# Patient Record
Sex: Female | Born: 1960 | Race: White | Hispanic: No | Marital: Single | Smoking: Never smoker
Health system: Southern US, Community
[De-identification: ages and names within clinical notes are randomized; demographics above are authoritative.]

## PROBLEM LIST (undated history)

## (undated) DIAGNOSIS — E039 Hypothyroidism, unspecified: Secondary | ICD-10-CM

## (undated) DIAGNOSIS — Z91199 Patient's noncompliance with other medical treatment and regimen due to unspecified reason: Secondary | ICD-10-CM

## (undated) DIAGNOSIS — Z9114 Patient's other noncompliance with medication regimen: Secondary | ICD-10-CM

## (undated) DIAGNOSIS — G43909 Migraine, unspecified, not intractable, without status migrainosus: Secondary | ICD-10-CM

## (undated) DIAGNOSIS — F329 Major depressive disorder, single episode, unspecified: Secondary | ICD-10-CM

## (undated) DIAGNOSIS — F419 Anxiety disorder, unspecified: Secondary | ICD-10-CM

## (undated) DIAGNOSIS — R0683 Snoring: Secondary | ICD-10-CM

## (undated) DIAGNOSIS — J449 Chronic obstructive pulmonary disease, unspecified: Secondary | ICD-10-CM

## (undated) DIAGNOSIS — E785 Hyperlipidemia, unspecified: Secondary | ICD-10-CM

## (undated) DIAGNOSIS — F32A Depression, unspecified: Secondary | ICD-10-CM

## (undated) DIAGNOSIS — G4733 Obstructive sleep apnea (adult) (pediatric): Secondary | ICD-10-CM

## (undated) DIAGNOSIS — G4700 Insomnia, unspecified: Secondary | ICD-10-CM

## (undated) DIAGNOSIS — F988 Other specified behavioral and emotional disorders with onset usually occurring in childhood and adolescence: Secondary | ICD-10-CM

## (undated) HISTORY — DX: Other specified behavioral and emotional disorders with onset usually occurring in childhood and adolescence: F98.8

## (undated) HISTORY — DX: Snoring: R06.83

## (undated) HISTORY — DX: Hyperlipidemia, unspecified: E78.5

## (undated) HISTORY — DX: Major depressive disorder, single episode, unspecified: F32.9

## (undated) HISTORY — PX: CLOSED REDUCTION ANKLE FRACTURE: SUR210

## (undated) HISTORY — DX: Anxiety disorder, unspecified: F41.9

## (undated) HISTORY — DX: Hypothyroidism, unspecified: E03.9

## (undated) HISTORY — DX: Migraine, unspecified, not intractable, without status migrainosus: G43.909

## (undated) HISTORY — DX: Chronic obstructive pulmonary disease, unspecified: J44.9

## (undated) HISTORY — DX: Patient's noncompliance with other medical treatment and regimen due to unspecified reason: Z91.199

## (undated) HISTORY — DX: Patient's other noncompliance with medication regimen: Z91.14

## (undated) HISTORY — DX: Obstructive sleep apnea (adult) (pediatric): G47.33

## (undated) HISTORY — DX: Depression, unspecified: F32.A

## (undated) HISTORY — DX: Insomnia, unspecified: G47.00

---

## 1998-10-11 ENCOUNTER — Ambulatory Visit: Admission: RE | Admit: 1998-10-11 | Discharge: 1998-10-11 | Payer: Self-pay

## 1999-01-06 ENCOUNTER — Ambulatory Visit: Admission: RE | Admit: 1999-01-06 | Discharge: 1999-01-06 | Payer: Self-pay | Admitting: Family Medicine

## 2002-11-06 ENCOUNTER — Encounter: Payer: Self-pay | Admitting: Family Medicine

## 2002-11-06 ENCOUNTER — Ambulatory Visit (HOSPITAL_COMMUNITY): Admission: RE | Admit: 2002-11-06 | Discharge: 2002-11-06 | Payer: Self-pay | Admitting: Family Medicine

## 2006-04-25 HISTORY — PX: OTHER SURGICAL HISTORY: SHX169

## 2006-05-09 ENCOUNTER — Encounter: Admission: RE | Admit: 2006-05-09 | Discharge: 2006-05-09 | Payer: Self-pay | Admitting: Otolaryngology

## 2006-10-10 ENCOUNTER — Encounter: Admission: RE | Admit: 2006-10-10 | Discharge: 2006-10-10 | Payer: Self-pay | Admitting: Internal Medicine

## 2006-12-18 ENCOUNTER — Ambulatory Visit: Payer: Self-pay | Admitting: Internal Medicine

## 2006-12-18 DIAGNOSIS — R197 Diarrhea, unspecified: Secondary | ICD-10-CM

## 2006-12-18 DIAGNOSIS — R195 Other fecal abnormalities: Secondary | ICD-10-CM

## 2006-12-18 DIAGNOSIS — R1032 Left lower quadrant pain: Secondary | ICD-10-CM | POA: Insufficient documentation

## 2007-01-30 ENCOUNTER — Ambulatory Visit: Payer: Self-pay | Admitting: Internal Medicine

## 2007-03-06 ENCOUNTER — Ambulatory Visit (HOSPITAL_COMMUNITY): Admission: RE | Admit: 2007-03-06 | Discharge: 2007-03-06 | Payer: Self-pay | Admitting: Internal Medicine

## 2007-04-04 ENCOUNTER — Ambulatory Visit: Payer: Self-pay | Admitting: Internal Medicine

## 2007-04-04 DIAGNOSIS — K589 Irritable bowel syndrome without diarrhea: Secondary | ICD-10-CM | POA: Insufficient documentation

## 2007-04-24 DIAGNOSIS — G4730 Sleep apnea, unspecified: Secondary | ICD-10-CM | POA: Insufficient documentation

## 2007-04-24 DIAGNOSIS — F909 Attention-deficit hyperactivity disorder, unspecified type: Secondary | ICD-10-CM | POA: Insufficient documentation

## 2007-04-24 DIAGNOSIS — F329 Major depressive disorder, single episode, unspecified: Secondary | ICD-10-CM

## 2007-12-04 ENCOUNTER — Encounter: Admission: RE | Admit: 2007-12-04 | Discharge: 2007-12-04 | Payer: Self-pay | Admitting: Internal Medicine

## 2009-02-26 ENCOUNTER — Encounter: Admission: RE | Admit: 2009-02-26 | Discharge: 2009-02-26 | Payer: Self-pay | Admitting: Internal Medicine

## 2009-08-27 DIAGNOSIS — F419 Anxiety disorder, unspecified: Secondary | ICD-10-CM | POA: Insufficient documentation

## 2009-08-27 DIAGNOSIS — E039 Hypothyroidism, unspecified: Secondary | ICD-10-CM | POA: Insufficient documentation

## 2010-09-07 NOTE — Assessment & Plan Note (Signed)
[facility]                         GASTROENTEROLOGY OFFICE NOTE   NAME:[name], [name]                        MRN:          [id number]  DATE:04/04/2007                            DOB:          04-19-1961    HISTORY OF PRESENT ILLNESS:  Karla [name] presents today for follow up.  She  was evaluated December 18, 2006, for abdominal pain, rectal pain,  diarrhea.  See that dictation for details.  She subsequently underwent  complete colonoscopy with intubation of the terminal ilium January 30, 2007.  The examination was normal.  She was felt to have irritable  bowel.  She also underwent an abdominal ultrasound March 06, 2007.  This was normal.  She presents now for follow up.   We discussed the results of her examinations.  She tells me that after  her colonoscopy her bowels were the best they have ever been in years.  However, they have returned to their usual which is unpredictable  urgency associated with loose stools alternating with periods of  constipation.  She has not self medicated.  She cannot seem to identify  a time of day that these problems are worse.  She states the diarrhea is  more problematic than constipation.  The problem seems to occur when she  is away from home.  No nocturnal symptoms.   PHYSICAL EXAMINATION:  GENERAL:  A well-appearing female in no acute  distress.  VITAL SIGNS:  Blood pressure 120/68, heart rate 88, weight 121.8 pounds.  ABDOMEN:  Was not reexamined.   IMPRESSION:  Irritable bowel syndrome with urgency and diarrhea  alternating with constipation.   RECOMMENDATIONS:  1. Fiber supplementation with Metamucil two tablespoons daily.  2. Probiotic Align one daily for two weeks.  3. Office follow up in six weeks to assess response to therapies.     Wilhemina Bonito. Marina Goodell, MD  Electronically Signed    JNP/MedQ  DD: 04/04/2007  DT: 04/05/2007  Job #: [id number]   cc:   Kari Baars, M.D.

## 2010-09-07 NOTE — Assessment & Plan Note (Signed)
[facility]                         GASTROENTEROLOGY OFFICE NOTE   NAME:POFFKazuko, Karla [name]                        MRN:          [id number]  DATE:12/18/2006                            DOB:          08/17/1960    OFFICE CONSULTATION NOTE:   REASON FOR CONSULTATION:  Abdominal pain, rectal bleeding, and diarrhea.   HISTORY:  This is a 50 year old white female with a history of ADHD,  depression, and sleep apnea.  She is referred through the courtesy of  Dr. Clelia Croft regarding the above-listed abdominal complaints.  Patient  reports chronic problems with loose stools alternating with constipation  most of her life.  These symptoms seem relatively mild and improved with  defecation.  However, more recently she described in mid to late May  awakening at night with severe abdominal pain and diaphoresis.  She had  a constipated feeling, though had diarrhea followed by rectal bleeding.  She has had several similar episodes.  She will occasionally have nausea  and vomiting.  Her abdominal pain tends to be in the left lower  quadrant.  She has not had formal GI workup.  Laboratories at Dr. Alver Fisher  office on Sep 06, 2006 were unremarkable.  Comprehensive metabolic panel  was normal, as was a CBC and lipid panel.  Urinalysis was also negative.  TSH was minimally elevated at 4.7.   PAST MEDICAL HISTORY:  1. ADHD.  2. Depression.  3. Sleep apnea.   PAST SURGICAL HISTORY:  1. Nasal surgery for abscess.  2. Tonsillectomy.   ALLERGIES:  SULFA.   CURRENT MEDICATIONS:  1. Adderall 30 mg 1-2 daily.  2. Yasmin birth control pill.  3. Remeron, unspecified dosage daily.  4. Xanax p.r.n.   FAMILY HISTORY:  Negative for inflammatory bowel disease or  gastrointestinal malignancy.  Mother has a history of diabetes.   SOCIAL HISTORY:  Patient is divorced, lives alone, has a Market researcher, is a Runner, broadcasting/film/video for [employer].  Does not smoke.  Does  use  alcohol.   REVIEW OF SYSTEMS:  Per diagnostic evaluation form.   PHYSICAL EXAMINATION:  A well-appearing female in no acute distress.  Blood pressure 120/68, heart rate 72, weight is 112.6 pounds.  She is 5  feet 6 inches in height.  HEENT:  Sclerae are anicteric.  Conjunctivae are pink.  Oral mucosa  intact.  No adenopathy.  LUNGS:  Clear.  HEART:  Regular.  ABDOMEN:  Soft without tenderness, mass, or hernia.  Good bowel sounds  heard.  RECTAL:  Deferred.  EXTREMITIES:  Without edema.   IMPRESSION:  1. Severe bout of abdominal pain followed by loose stools and      bleeding, consistent with ischemic colitis.  2. More chronic abdominal complaints consistent with irritable bowel      syndrome.  3. Intermittent rectal bleeding, possibly due to hemorrhoids.   RECOMMENDATIONS:  1. Abdominal ultrasound to rule out gallstones as a cause for pain.  2. Schedule colonoscopy to rule out inflammatory bowel disease or      neoplasia.   The patient requests having the evaluations performed  after October 1  for financial reasons.     Wilhemina Bonito. Marina Goodell, MD  Electronically Signed    JNP/MedQ  DD: 12/18/2006  DT: 12/19/2006  Job #: [id number]   cc:   Kari Baars, M.D.

## 2014-04-10 ENCOUNTER — Encounter: Payer: Self-pay | Admitting: *Deleted

## 2014-04-14 ENCOUNTER — Other Ambulatory Visit: Payer: Self-pay

## 2014-04-14 DIAGNOSIS — Z1231 Encounter for screening mammogram for malignant neoplasm of breast: Secondary | ICD-10-CM

## 2014-04-16 ENCOUNTER — Ambulatory Visit
Admission: RE | Admit: 2014-04-16 | Discharge: 2014-04-16 | Disposition: A | Discharge status: Home / Self Care | Payer: BC Managed Care – PPO | Source: Ambulatory Visit

## 2014-04-16 DIAGNOSIS — Z1231 Encounter for screening mammogram for malignant neoplasm of breast: Secondary | ICD-10-CM

## 2014-04-21 ENCOUNTER — Ambulatory Visit (INDEPENDENT_AMBULATORY_CARE_PROVIDER_SITE_OTHER): Payer: BC Managed Care – PPO | Admitting: Neurology

## 2014-04-21 ENCOUNTER — Encounter: Payer: Self-pay | Admitting: Neurology

## 2014-04-21 VITALS — BP 120/78 | HR 89 | Resp 16 | Ht 65.5 in | Wt 128.0 lb

## 2014-04-21 DIAGNOSIS — R0683 Snoring: Secondary | ICD-10-CM

## 2014-04-21 HISTORY — DX: Snoring: R06.83
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
## 2014-05-30 ENCOUNTER — Ambulatory Visit (INDEPENDENT_AMBULATORY_CARE_PROVIDER_SITE_OTHER): Payer: BC Managed Care – PPO | Admitting: Neurology

## 2014-05-30 VITALS — BP 135/80 | HR 96

## 2014-05-30 DIAGNOSIS — R0683 Snoring: Secondary | ICD-10-CM

## 2014-05-30 DIAGNOSIS — G4719 Other hypersomnia: Secondary | ICD-10-CM

## 2014-05-30 DIAGNOSIS — G479 Sleep disorder, unspecified: Secondary | ICD-10-CM

## 2014-05-31 NOTE — Sleep Study (Signed)
Please see the scanned sleep study interpretation located in the Procedure Tab within the Chart review section

## 2014-06-16 ENCOUNTER — Encounter: Payer: Self-pay | Admitting: Neurology

## 2014-06-16 DIAGNOSIS — G4719 Other hypersomnia: Secondary | ICD-10-CM | POA: Insufficient documentation

## 2014-06-18 ENCOUNTER — Telehealth: Payer: Self-pay | Admitting: *Deleted

## 2014-06-18 NOTE — Telephone Encounter (Signed)
Patient was contacted and provided a detailed message regarding her sleep study results.  She was instructed to contact our office to schedule a 30 min follow up appointment with Dr. Vickey Hugerohmeier to go over the results.  Dr. Martha ClanWilliam Shaw was faxed a copy of the results.

## 2015-03-17 DIAGNOSIS — G2581 Restless legs syndrome: Secondary | ICD-10-CM | POA: Insufficient documentation

## 2015-04-29 ENCOUNTER — Other Ambulatory Visit: Payer: Self-pay

## 2015-04-29 DIAGNOSIS — Z1231 Encounter for screening mammogram for malignant neoplasm of breast: Secondary | ICD-10-CM

## 2015-05-19 ENCOUNTER — Ambulatory Visit
Admission: RE | Admit: 2015-05-19 | Discharge: 2015-05-19 | Disposition: A | Discharge status: Home / Self Care | Payer: BC Managed Care – PPO | Source: Ambulatory Visit

## 2015-05-19 DIAGNOSIS — Z1231 Encounter for screening mammogram for malignant neoplasm of breast: Secondary | ICD-10-CM

## 2017-02-21 ENCOUNTER — Encounter: Payer: Self-pay | Admitting: Internal Medicine

## 2017-09-22 ENCOUNTER — Ambulatory Visit: Payer: Self-pay | Admitting: Urgent Care

## 2017-09-22 ENCOUNTER — Ambulatory Visit: Payer: Self-pay | Admitting: *Deleted

## 2017-09-22 NOTE — Telephone Encounter (Signed)
Pt reports dropped can on right great toe this am at 0730. States "gash" about 1 inch across top of toe. Bled initially, had to change guaze x 1, present dressing dry for several hours. Pt states she feels it may need sutures. Spoke to [facility] who stated practice would see for laceration. Appt made with Marlowe ShoresM. Mani for today at 1320. Care advise given per protocol.  Reason for Disposition . Skin is split open or gaping  (or length > 1/2 inch or 12 mm)    Spoke with Carollee HerterShannon. Practice will see patient for laceration.  Answer Assessment - Initial Assessment Questions 1. MECHANISM: "How did the injury happen?"      Dropped can on right great toe 2. ONSET: "When did the injury happen?" (Minutes or hours ago)      0730 3. LOCATION: "What part of the toe is injured?" "Is the nail damaged?"      Top, across joint 4. APPEARANCE of TOE INJURY: "What does the injury look like?"      "Gash, appears deep." 5. SEVERITY: "Can you use the foot normally?" "Can you walk?"      Yes 6. SIZE: For cuts, bruises, or swelling, ask: "How large is it?" (e.g., inches or centimeters;  entire toe)      1 inch, bleed initially, changed guaze x 1. Present dressing dry. 7. PAIN: "Is there pain?" If so, ask: "How bad is the pain?"   (e.g., Scale 1-10; or mild, moderate, severe)     Yes, moderate 8. TETANUS: For any breaks in the skin, ask: "When was the last tetanus booster?"     unsure 9. DIABETES: "Do you have a history of diabetes or poor circulation in the feet?"     no 10. OTHER SYMPTOMS: "Do you have any other symptoms?"        no  Protocols used: TOE INJURY-A-AH

## 2018-04-10 DIAGNOSIS — M797 Fibromyalgia: Secondary | ICD-10-CM | POA: Insufficient documentation

## 2018-04-10 DIAGNOSIS — M791 Myalgia, unspecified site: Secondary | ICD-10-CM | POA: Insufficient documentation

## 2019-04-30 DIAGNOSIS — G629 Polyneuropathy, unspecified: Secondary | ICD-10-CM | POA: Insufficient documentation

## 2019-05-03 ENCOUNTER — Other Ambulatory Visit: Payer: Self-pay | Admitting: Internal Medicine

## 2019-05-03 DIAGNOSIS — E785 Hyperlipidemia, unspecified: Secondary | ICD-10-CM

## 2019-05-09 ENCOUNTER — Other Ambulatory Visit (HOSPITAL_COMMUNITY): Payer: Self-pay | Admitting: Internal Medicine

## 2019-05-09 ENCOUNTER — Other Ambulatory Visit: Payer: Self-pay | Admitting: Internal Medicine

## 2019-05-09 DIAGNOSIS — J90 Pleural effusion, not elsewhere classified: Secondary | ICD-10-CM

## 2019-05-10 ENCOUNTER — Other Ambulatory Visit: Payer: Self-pay

## 2019-05-10 ENCOUNTER — Ambulatory Visit (HOSPITAL_COMMUNITY)
Admission: RE | Admit: 2019-05-10 | Discharge: 2019-05-10 | Disposition: A | Discharge status: Home / Self Care | Payer: BC Managed Care – PPO | Source: Ambulatory Visit | Attending: Internal Medicine | Admitting: Internal Medicine

## 2019-05-10 ENCOUNTER — Ambulatory Visit (HOSPITAL_COMMUNITY)
Admission: RE | Admit: 2019-05-10 | Discharge: 2019-05-10 | Disposition: A | Discharge status: Home / Self Care | Payer: BC Managed Care – PPO | Source: Ambulatory Visit | Attending: Student | Admitting: Student

## 2019-05-10 ENCOUNTER — Inpatient Hospital Stay (HOSPITAL_COMMUNITY): Admission: RE | Admit: 2019-05-10 | Payer: BC Managed Care – PPO | Source: Ambulatory Visit

## 2019-05-10 DIAGNOSIS — J90 Pleural effusion, not elsewhere classified: Secondary | ICD-10-CM | POA: Insufficient documentation

## 2019-05-10 DIAGNOSIS — Z9889 Other specified postprocedural states: Secondary | ICD-10-CM

## 2019-05-10 HISTORY — PX: IR THORACENTESIS ASP PLEURAL SPACE W/IMG GUIDE: IMG5380

## 2019-05-10 MED ORDER — LIDOCAINE HCL (PF) 1 % IJ SOLN
INTRAMUSCULAR | Status: DC | PRN
  Administered 2019-05-10: 30 mL

## 2019-05-10 MED ORDER — LIDOCAINE HCL 1 % IJ SOLN
INTRAMUSCULAR | Status: AC
  Filled 2019-05-10: qty 20

## 2019-05-10 NOTE — Procedures (Signed)
PROCEDURE SUMMARY:  Successful US guided right therapeutic thoracentesis. Yielded 500 mL of bloody fluid. Pt tolerated procedure well. No immediate complications.  Specimen was not sent for labs. CXR ordered.  EBL < 5 mL  Hoyt Koch PA-C 05/10/2019 3:10 PM

## 2019-05-13 ENCOUNTER — Ambulatory Visit (HOSPITAL_COMMUNITY): Payer: BC Managed Care – PPO

## 2019-05-23 ENCOUNTER — Telehealth: Payer: Self-pay | Admitting: Physician Assistant

## 2019-05-23 NOTE — Telephone Encounter (Signed)
Called to discuss with Karla [name] about Covid symptoms and the use of bamlanivimab, a monoclonal antibody infusion for those with mild to moderate Covid symptoms and at a high risk of hospitalization.     Pt is not qualified for this infusion due to lack of identified risk factors and co-morbid conditions.  Symptoms reviewed as well as criteria for ending isolation.  Symptoms reviewed that would warrant ED/Hospital evaluation as well should her condition worsen. Preventative practices reviewed. Patient verbalized understanding.  Patient Active Problem List   Diagnosis Date Noted  . Excessive daytime sleepiness 06/16/2014  . Snoring 04/21/2014  . DEPRESSIVE DISORDER NOT ELSEWHERE CLASSIFIED 04/24/2007  . ADHD 04/24/2007  . SLEEP APNEA 04/24/2007  . IRRITABLE BOWEL SYNDROME 04/04/2007  . DIARRHEA 12/18/2006  . ABDOMINAL PAIN, LEFT LOWER QUADRANT 12/18/2006  . NONSPECIFIC ABNORMAL FINDING IN STOOL CONTENTS 12/18/2006     Cline Crock PA-C  [facility]

## 2019-06-05 ENCOUNTER — Other Ambulatory Visit: Payer: Self-pay | Admitting: Internal Medicine

## 2019-06-05 DIAGNOSIS — Z1231 Encounter for screening mammogram for malignant neoplasm of breast: Secondary | ICD-10-CM

## 2019-06-14 ENCOUNTER — Other Ambulatory Visit: Payer: BC Managed Care – PPO

## 2019-06-20 ENCOUNTER — Ambulatory Visit: Payer: BC Managed Care – PPO

## 2019-06-21 ENCOUNTER — Ambulatory Visit: Payer: BC Managed Care – PPO

## 2019-07-03 ENCOUNTER — Other Ambulatory Visit: Payer: Self-pay

## 2019-07-03 ENCOUNTER — Ambulatory Visit
Admission: RE | Admit: 2019-07-03 | Discharge: 2019-07-03 | Disposition: A | Discharge status: Home / Self Care | Payer: BC Managed Care – PPO | Source: Ambulatory Visit | Attending: Internal Medicine | Admitting: Internal Medicine

## 2019-07-03 DIAGNOSIS — Z1231 Encounter for screening mammogram for malignant neoplasm of breast: Secondary | ICD-10-CM

## 2019-07-05 ENCOUNTER — Ambulatory Visit: Payer: BC Managed Care – PPO | Attending: Internal Medicine

## 2019-07-05 DIAGNOSIS — Z23 Encounter for immunization: Secondary | ICD-10-CM

## 2019-07-05 NOTE — Progress Notes (Signed)
   Covid-19 Vaccination Clinic  Name:  Karla [name]    MRN: [id number] DOB: 10-May-1960  07/05/2019  [name] was observed post Covid-19 immunization for 15 minutes without incident. She was provided with Vaccine Information Sheet and instruction to access the V-Safe system.   [name] was instructed to call 911 with any severe reactions post vaccine: Marland Kitchen Difficulty breathing  . Swelling of face and throat  . A fast heartbeat  . A bad rash all over body  . Dizziness and weakness   Immunizations Administered    Name Date Dose VIS Date Route   Pfizer COVID-19 Vaccine 07/05/2019  2:18 PM 0.3 mL 04/05/2019 Intramuscular   Manufacturer: [employer], [employer]   Lot: [id number]   NDC: [address]-5

## 2019-07-11 ENCOUNTER — Ambulatory Visit: Payer: BC Managed Care – PPO

## 2019-07-31 ENCOUNTER — Ambulatory Visit: Payer: BC Managed Care – PPO

## 2019-08-07 ENCOUNTER — Other Ambulatory Visit: Payer: BC Managed Care – PPO

## 2019-11-28 ENCOUNTER — Other Ambulatory Visit: Payer: Self-pay

## 2019-11-28 ENCOUNTER — Ambulatory Visit: Payer: BC Managed Care – PPO | Admitting: Nurse Practitioner

## 2019-11-28 ENCOUNTER — Encounter: Payer: Self-pay | Admitting: Nurse Practitioner

## 2019-11-28 VITALS — BP 132/84 | HR 85 | Temp 97.1°F | Ht 66.0 in | Wt 124.0 lb

## 2019-11-28 DIAGNOSIS — R0602 Shortness of breath: Secondary | ICD-10-CM

## 2019-11-28 DIAGNOSIS — Z8616 Personal history of COVID-19: Secondary | ICD-10-CM | POA: Diagnosis not present

## 2019-11-28 DIAGNOSIS — R053 Chronic cough: Secondary | ICD-10-CM | POA: Insufficient documentation

## 2019-11-28 DIAGNOSIS — R439 Unspecified disturbances of smell and taste: Secondary | ICD-10-CM | POA: Insufficient documentation

## 2019-11-28 DIAGNOSIS — R2689 Other abnormalities of gait and mobility: Secondary | ICD-10-CM | POA: Diagnosis not present

## 2019-11-28 DIAGNOSIS — R413 Other amnesia: Secondary | ICD-10-CM | POA: Diagnosis not present

## 2019-11-28 DIAGNOSIS — R05 Cough: Secondary | ICD-10-CM

## 2019-11-28 DIAGNOSIS — J90 Pleural effusion, not elsewhere classified: Secondary | ICD-10-CM | POA: Insufficient documentation

## 2019-11-28 MED ORDER — OMEPRAZOLE 20 MG PO CPDR: 20.0000 mg | DELAYED_RELEASE_CAPSULE | Freq: Every day | ORAL | 3 refills | Status: AC

## 2019-11-28 NOTE — Progress Notes (Signed)
@Patient  ID: , female    DOB: [birth year], 59 y.o.   MRN: [id number]  Chief Complaint  Patient presents with  . Establish Care    Tested positive January 2021. Sx: Cough, SOB, Brain fog, intermittent smell and taste.     Referring provider: February 2021, MD   59 year old female with history of migraines, sleep apnea, IBS, hypothyroidism, anxiety, depression, hyperlipidemia, ADHD.  Diagnosed with Covid in January 2021.  HPI  Patient presents today for post Covid care clinic visit.  She states that she was diagnosed with Covid in January 2021.  A couple weeks prior to being diagnosed with Covid she had fallen and broke multiple ribs.  She also punctured a lung and underwent thoracentesis.  While she was recovering a neighbor came over to help her move some things in the house and unknowingly exposed her to COVID.  Patient states that since that time she still has chest congestion, cough, shortness of breath with exertion, brain fog and memory loss, intermittent loss of taste and smell with altered taste and smell.  Patient has had a follow-up CT scan in March which showed groundglass opacity bilaterally which reflected slowly resolving pneumonia at that time.  She also had trace pleural effusion.  Recent chest x-ray showed stable left pleural effusion with multiple rib fractures.  Patient has been prescribed Wixela inhaler and albuterol inhaler by PCP.  She states that she does feel like this is helping.  Patient also complains today of ongoing balance and coordination issues following Covid. Denies f/c/s, n/v/d, hemoptysis, PND, chest pain or edema.      Allergies  Allergen Reactions  . Sulfa Antibiotics     Immunization History  Administered Date(s) Administered  . PFIZER SARS-COV-2 Vaccination 07/05/2019    Past Medical History:  Diagnosis Date  . ADD (attention deficit disorder)   . Anxiety   . Depression   . Hyperlipidemia   . Hypothyroidism   . Insomnia    . Migraine   . Noncompliance with CPAP treatment   . OSA (obstructive sleep apnea)   . Snoring 04/21/2014    Tobacco History: Social History   Tobacco Use  Smoking Status Never Smoker   Counseling given: Not Answered   Outpatient Encounter Medications as of 11/28/2019  Medication Sig  . albuterol (PROVENTIL HFA;VENTOLIN HFA) 108 (90 BASE) MCG/ACT inhaler Inhale 2 puffs into the lungs every 6 (six) hours as needed for wheezing or shortness of breath.  . ALPRAZolam (XANAX) 1 MG tablet   . amphetamine-dextroamphetamine (ADDERALL XR) 30 MG 24 hr capsule Take 30 mg by mouth 2 (two) times daily.  01/28/2020 levothyroxine (SYNTHROID, LEVOTHROID) 50 MCG tablet   . mirtazapine (REMERON) 30 MG tablet Take 60 mg by mouth at bedtime.  Marland Kitchen INHUB 250-50 MCG/DOSE AEPB Inhale 1 puff into the lungs 2 (two) times daily.  . DULoxetine (CYMBALTA) 60 MG capsule Take 60 mg by mouth daily.  Monte Fantasia ibuprofen (ADVIL) 600 MG tablet ibuprofen 600 mg tablet  . omeprazole (PRILOSEC) 20 MG capsule Take 1 capsule (20 mg total) by mouth daily.   No facility-administered encounter medications on file as of 11/28/2019.     Review of Systems  Review of Systems  Constitutional: Positive for fatigue. Negative for chills and fever.  HENT: Negative.   Respiratory: Positive for cough and shortness of breath. Negative for wheezing.   Cardiovascular: Negative.  Negative for chest pain, palpitations and leg swelling.  Gastrointestinal: Negative.   Allergic/Immunologic: Negative.  Neurological: Negative.        Issues with balance and coordination, and altered taste and smell  Psychiatric/Behavioral: Positive for decreased concentration.       Physical Exam  BP 132/84   Pulse 85   Temp (!) 97.1 F (36.2 C)   Ht 5\' 6"  (1.676 m)   Wt 124 lb (56.2 kg)   SpO2 98%   BMI 20.01 kg/m   Wt Readings from Last 5 Encounters:  11/28/19 124 lb (56.2 kg)  04/21/14 128 lb (58.1 kg)     Physical Exam Vitals and nursing  note reviewed.  Constitutional:      General: She is not in acute distress.    Appearance: She is well-developed.  Cardiovascular:     Rate and Rhythm: Normal rate and regular rhythm.  Pulmonary:     Effort: Pulmonary effort is normal.     Breath sounds: Normal breath sounds.  Neurological:     Mental Status: She is alert and oriented to person, place, and time.        Assessment & Plan:   History of COVID-19 Cough Shortness of breath:  Continue current inhalers  Recent chest x ray showed stable pleural effusion  Continue deep breathing exercises  Cough:  -Start Prilosec -  gastroesophageal reflux disease treatment with elevating the head your bed and taking antacids  -Continue taking over-the-counter antihistamines and nasal fluticasone to help with allergic rhinitis  -You need to try to suppress your cough to allow your larynx (voice box) to heal.  For three days don't talk, laugh, sing, or clear your throat. Do everything you can to suppress the cough during this time.  -Use hard candies (sugarless Jolly Ranchers) or non-mint or non-menthol containing cough drops during this time to soothe your throat.  Use a cough suppressant (Delsym) around the clock during this time.   - take sips of water when you feel like you need to clear your throat or cough  - After three days, gradually increase the use of your voice and back off on the cough suppressants.  Memory Loss Olfactory dysfunction Unsteady gait / balance issues:  Will refer to neurology - Dr. 04/23/14 - for further evaluation and treatment  Hand out given on memory care tips  Follow up:  Follow up in 2 months or sooner if needed          Vickey Huger, NP 11/28/2019

## 2019-11-28 NOTE — Patient Instructions (Addendum)
History of covid Cough Shortness of breath:  Continue current inhalers  Recent chest x ray showed stable pleural effusion   Continue deep breathing exercises  Cough:  -Start Prilosec -  gastroesophageal reflux disease treatment with elevating the head your bed and taking antacids  -Continue taking over-the-counter antihistamines and nasal fluticasone to help with allergic rhinitis  -You need to try to suppress your cough to allow your larynx (voice box) to heal.  For three days don't talk, laugh, sing, or clear your throat. Do everything you can to suppress the cough during this time.  -Use hard candies (sugarless Jolly Ranchers) or non-mint or non-menthol containing cough drops during this time to soothe your throat.  Use a cough suppressant (Delsym) around the clock during this time.   - take sips of water when you feel like you need to clear your throat or cough  - After three days, gradually increase the use of your voice and back off on the cough suppressants.  Memory Loss Olfactory dysfunction Unsteady gait / balance issues:  Will refer to neurology - Dr. Vickey Huger - for further evaluation and treatment  Hand out given on memory care tips  Follow up:  Follow up in 2 months or sooner if needed

## 2019-11-28 NOTE — Assessment & Plan Note (Addendum)
Cough Shortness of breath:  Continue current inhalers  Recent chest x ray showed stable pleural effusion  Continue deep breathing exercises  Cough:  -Start Prilosec -  gastroesophageal reflux disease treatment with elevating the head your bed and taking antacids  -Continue taking over-the-counter antihistamines and nasal fluticasone to help with allergic rhinitis  -You need to try to suppress your cough to allow your larynx (voice box) to heal.  For three days don't talk, laugh, sing, or clear your throat. Do everything you can to suppress the cough during this time.  -Use hard candies (sugarless Jolly Ranchers) or non-mint or non-menthol containing cough drops during this time to soothe your throat.  Use a cough suppressant (Delsym) around the clock during this time.   - take sips of water when you feel like you need to clear your throat or cough  - After three days, gradually increase the use of your voice and back off on the cough suppressants.  Memory Loss Olfactory dysfunction Unsteady gait / balance issues:  Will refer to neurology - Dr. Vickey Huger - for further evaluation and treatment  Hand out given on memory care tips  Follow up:  Follow up in 2 months or sooner if needed

## 2020-01-21 ENCOUNTER — Ambulatory Visit: Payer: BC Managed Care – PPO | Admitting: Pulmonary Disease

## 2020-01-21 ENCOUNTER — Other Ambulatory Visit: Payer: Self-pay

## 2020-01-21 ENCOUNTER — Ambulatory Visit (INDEPENDENT_AMBULATORY_CARE_PROVIDER_SITE_OTHER): Payer: BC Managed Care – PPO

## 2020-01-21 ENCOUNTER — Encounter: Payer: Self-pay | Admitting: Pulmonary Disease

## 2020-01-21 VITALS — BP 124/76 | HR 105 | Temp 98.3°F | Ht 66.0 in | Wt 122.6 lb

## 2020-01-21 DIAGNOSIS — R0602 Shortness of breath: Secondary | ICD-10-CM

## 2020-01-21 DIAGNOSIS — U071 COVID-19: Secondary | ICD-10-CM | POA: Diagnosis not present

## 2020-01-21 DIAGNOSIS — J90 Pleural effusion, not elsewhere classified: Secondary | ICD-10-CM

## 2020-01-21 DIAGNOSIS — R053 Chronic cough: Secondary | ICD-10-CM

## 2020-01-21 DIAGNOSIS — R05 Cough: Secondary | ICD-10-CM | POA: Diagnosis not present

## 2020-01-21 DIAGNOSIS — J1282 Pneumonia due to coronavirus disease 2019: Secondary | ICD-10-CM

## 2020-01-21 NOTE — Patient Instructions (Signed)
We will check chest xray to monitor your pleural effusion We will check pulmonary function tests and call you with the results Use flonase nasal spray, 1 spray per nostril daily Continue omeprazole for acid reflux and elevate the head of your bed with blocks Follow up in 4 months

## 2020-01-21 NOTE — Progress Notes (Signed)
Synopsis: Referred by Karla Selleronya Nichols, NP for cough, dyspnea and pleural effusion  Subjective:   PATIENT ID: Karla [name] GENDER: female DOB: 12/20/1960, MRN: [id number]   HPI  Chief Complaint  Patient presents with  . Consult    SOB at times but it has improved.Cracked ribs due to an accident in January.   Karla [name] is a 59 year old woman, non-smoker with anxiety/depression who is referred to pulmonary clinic for dyspnea and pleural effusion.   She reports falling off a step stool and landing on a church pew in January suffering multiple rib fractures on her left with findings of a left pleural effusion. Thoracentesis was performed by IR with removal of 500mL of bloody fluid. No pleural fluid studies are located in the EMR. She then was ill with covid 19 pneumonia in late January where she developed cough and shortness of breath. The shortness of breath has persisted since then with some improvement. She was started on Wixella and albuterol in March for the dyspnea which helped at that time but she does not know if she is benefiting from the inhalers at this time. She has cut back to once daily dosing of the wixella and does not report any worsening in her symptoms. She reports using the albuterol multiple times per day and this has not changed since reducing her wixella dosing. She was started on omeprazole for the cough which she says has helped significantly, but she continues to cough at times. The cough is non-productive. She does complain of some nasal congestion and drainage.   Past Medical History:  Diagnosis Date  . ADD (attention deficit disorder)   . Anxiety   . Depression   . Hyperlipidemia   . Hypothyroidism   . Insomnia   . Migraine   . Noncompliance with CPAP treatment   . OSA (obstructive sleep apnea)   . Snoring 04/21/2014     Family History  Problem Relation Age of Onset  . Asthma Father   . Diabetes Mother   . Heart disease Mother   . Skin cancer  Paternal Grandmother   . Alcoholism Paternal Grandfather   . Prostate cancer Paternal Grandfather   . Lung cancer Maternal Grandfather      Social History   Socioeconomic History  . Marital status: Single    Spouse name: Not on file  . Number of children: Not on file  . Years of education: Not on file  . Highest education level: Not on file  Occupational History  . Not on file  Tobacco Use  . Smoking status: Never Smoker  . Smokeless tobacco: Never Used  Substance and Sexual Activity  . Alcohol use: Yes    Alcohol/week: 0.0 standard drinks    Comment: wine  . Drug use: No  . Sexual activity: Not on file  Other Topics Concern  . Not on file  Social History Narrative   Caffeine 2 drinks per day.  Divorced with no children, 2 step sons, 1 grandchild.  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     Social Determinants of Health   Financial Resource Strain:   . Difficulty of Paying Living Expenses: Not on file  Food Insecurity:   . Worried About Programme researcher, broadcasting/film/videounning Out of Food in the Last Year: Not on file  . Ran Out of Food in the Last Year: Not on file  Transportation Needs:   . Lack of Transportation (Medical): Not on file  . Lack of Transportation (Non-Medical): Not on file  Physical Activity:   . Days of Exercise per Week: Not on file  . Minutes of Exercise per Session: Not on file  Stress:   . Feeling of Stress : Not on file  Social Connections:   . Frequency of Communication with Friends and Family: Not on file  . Frequency of Social Gatherings with Friends and Family: Not on file  . Attends Religious Services: Not on file  . Active Member of Clubs or Organizations: Not on file  . Attends Banker Meetings: Not on file  . Marital Status: Not on file  Intimate Partner Violence:   . Fear of Current or Ex-Partner: Not on file  . Emotionally Abused: Not on file  . Physically Abused: Not on file  . Sexually Abused: Not on file     Allergies  Allergen Reactions  . Sulfa  Antibiotics      Outpatient Medications Prior to Visit  Medication Sig Dispense Refill  . albuterol (PROVENTIL HFA;VENTOLIN HFA) 108 (90 BASE) MCG/ACT inhaler Inhale 2 puffs into the lungs every 6 (six) hours as needed for wheezing or shortness of breath.    . ALPRAZolam (XANAX) 1 MG tablet     . amphetamine-dextroamphetamine (ADDERALL XR) 30 MG 24 hr capsule Take 30 mg by mouth 2 (two) times daily.    . DULoxetine (CYMBALTA) 60 MG capsule Take 60 mg by mouth daily.    . fluticasone (FLONASE) 50 MCG/ACT nasal spray Place 1 spray into both nostrils daily.    Marland Kitchen ibuprofen (ADVIL) 600 MG tablet ibuprofen 600 mg tablet    . levothyroxine (SYNTHROID, LEVOTHROID) 50 MCG tablet   5  . mirtazapine (REMERON) 30 MG tablet Take 60 mg by mouth at bedtime.    Marland Kitchen omeprazole (PRILOSEC) 20 MG capsule Take 1 capsule (20 mg total) by mouth daily. 30 capsule 3  . WIXELA INHUB 250-50 MCG/DOSE AEPB Inhale 1 puff into the lungs 2 (two) times daily.     No facility-administered medications prior to visit.    Review of Systems  Constitutional: Negative for chills, diaphoresis, fever, malaise/fatigue and weight loss.  HENT: Positive for congestion. Negative for ear discharge, ear pain, nosebleeds, sinus pain and sore throat.   Eyes: Negative for blurred vision.  Respiratory: Positive for cough, shortness of breath and wheezing. Negative for hemoptysis and sputum production.   Cardiovascular: Negative for chest pain, palpitations, orthopnea, claudication, leg swelling and PND.  Gastrointestinal: Negative for abdominal pain, blood in stool, constipation, diarrhea, heartburn, melena, nausea and vomiting.  Genitourinary: Negative for dysuria and hematuria.  Musculoskeletal: Negative for joint pain and myalgias.  Skin: Negative for itching and rash.  Neurological: Negative for dizziness, weakness and headaches.  Endo/Heme/Allergies: Does not bruise/bleed easily.  Psychiatric/Behavioral: The patient is  nervous/anxious.     Objective:   Vitals:   01/21/20 1611  BP: 124/76  Pulse: (!) 105  Temp: 98.3 F (36.8 C)  TempSrc: Oral  SpO2: 97%  Weight: 122 lb 9.6 oz (55.6 kg)  Height: 5\' 6"  (1.676 m)     Physical Exam Constitutional:      General: She is not in acute distress.    Appearance: Normal appearance. She is normal weight. She is not ill-appearing.  HENT:     Head: Normocephalic and atraumatic.     Nose: Nose normal.     Mouth/Throat:     Mouth: Mucous membranes are moist.     Pharynx: Oropharynx is clear.  Eyes:     General: No scleral  icterus.    Extraocular Movements: Extraocular movements intact.     Conjunctiva/sclera: Conjunctivae normal.     Pupils: Pupils are equal, round, and reactive to light.  Cardiovascular:     Rate and Rhythm: Normal rate and regular rhythm.     Pulses: Normal pulses.     Heart sounds: Normal heart sounds. No murmur heard.   Pulmonary:     Effort: Pulmonary effort is normal. No respiratory distress.     Breath sounds: Normal breath sounds. No wheezing, rhonchi or rales.  Abdominal:     General: Bowel sounds are normal. There is no distension.     Palpations: Abdomen is soft.     Tenderness: There is no abdominal tenderness.  Musculoskeletal:     Cervical back: Neck supple.     Right lower leg: No edema.     Left lower leg: No edema.  Lymphadenopathy:     Cervical: No cervical adenopathy.  Skin:    General: Skin is warm and dry.     Capillary Refill: Capillary refill takes less than 2 seconds.  Neurological:     General: No focal deficit present.     Mental Status: She is alert and oriented to person, place, and time. Mental status is at baseline.     Gait: Gait normal.  Psychiatric:        Mood and Affect: Mood normal.        Behavior: Behavior normal.        Thought Content: Thought content normal.        Judgment: Judgment normal.     CBC No results found for: WBC, RBC, HGB, HCT, PLT, MCV, MCH, MCHC, RDW,  LYMPHSABS, MONOABS, EOSABS, BASOSABS   Chest imaging: CXR 05/10/19 Mediastinum and hilar structures normal. Heart size normal. Mild left base subsegmental atelectasis/infiltrate. Small left pleural effusion. Left posterior sixth, seventh, eighth, and ninth rib fractures are noted. No pneumothorax.  CTA Chest 06/28/19 - Pulmonary Arteries: There is no evidence of pulmonary embolus.   - Lower Neck: Unremarkable.    - Cardiovascular: Normal heart size. No pericardial effusion. Normal caliber thoracic aorta and main pulmonary artery.   - Mediastinum, Hila and Axilla: No lymphadenopathy or masses.   - Lungs and Pleura: Central airways are patent. There is a tiny left pleural effusion with areas of associated high density. No right pleural effusion. No pneumothorax. There is minimal scattered groundglass opacity within the bilateral upper lobes and right lower lobe. Right lower lobe 5 mm nodule (series 4, image 305).   - Upper Abdomen: There is a small hiatal hernia with distal esophageal wall thickening.   - Musculoskeletal: Left lateral sixth rib fracture with callus. Displaced left lateral seventh rib fracture, eighth rib fracture, ninth rib fracture, and 10th rib fracture with varying degrees of callus. Some of these are fractured in multiple locations.     Assessment & Plan:   Pneumonia due to COVID-19 virus  Shortness of breath - Plan: Pulmonary Function Test  Chronic cough  Pleural effusion - Plan: DG Chest 2 View  Discussion: Karla [name] is a 59 year old woman, non-smoker with anxiety/depression who is referred to pulmonary clinic for dyspnea and pleural effusion.   She has persistent dyspnea secondary to covid 19 pneumonia, which has been gradually improving. She has been on Wixella and albuterol for her dyspnea, which initially helped but she is unable to identify a strong benefit at this time. I have instructed her to wean off the wixella inhaler  and monitor her symptoms. If  she dyspnea or wheezing were to get worse, then she can restart using the [facility] inhaler. We will obtain full pulmonary function testing.   Her chronic cough appears to be related to GERD after significant improvement with omeprazole therapy. She does have a small hiatal hernia noted on her recent CTA chest. I have instructed her to raise the head of her bed with blocks to reduce the reflux symptoms. She also has a component of rhinitis which we will treat with fluticasone nasal spray.  We will check a chest x-ray to monitor her pleural effusion. She likely had hemothroax in setting of trauma/rib fractures. Most importantly the blood at that time was evacuated via thoracentesis. She may have residual thickening of the pleura in that region.   She is to follow up in 4 months  Melody Comas, MD [facility] Office: [phone]   Current Outpatient Medications:  .  albuterol (PROVENTIL HFA;VENTOLIN HFA) 108 (90 BASE) MCG/ACT inhaler, Inhale 2 puffs into the lungs every 6 (six) hours as needed for wheezing or shortness of breath., Disp: , Rfl:  .  ALPRAZolam (XANAX) 1 MG tablet, , Disp: , Rfl:  .  amphetamine-dextroamphetamine (ADDERALL XR) 30 MG 24 hr capsule, Take 30 mg by mouth 2 (two) times daily., Disp: , Rfl:  .  DULoxetine (CYMBALTA) 60 MG capsule, Take 60 mg by mouth daily., Disp: , Rfl:  .  fluticasone (FLONASE) 50 MCG/ACT nasal spray, Place 1 spray into both nostrils daily., Disp: , Rfl:  .  ibuprofen (ADVIL) 600 MG tablet, ibuprofen 600 mg tablet, Disp: , Rfl:  .  levothyroxine (SYNTHROID, LEVOTHROID) 50 MCG tablet, , Disp: , Rfl: 5 .  mirtazapine (REMERON) 30 MG tablet, Take 60 mg by mouth at bedtime., Disp: , Rfl:  .  omeprazole (PRILOSEC) 20 MG capsule, Take 1 capsule (20 mg total) by mouth daily., Disp: 30 capsule, Rfl: 3 .  WIXELA INHUB 250-50 MCG/DOSE AEPB, Inhale 1 puff into the lungs 2 (two) times daily., Disp: , Rfl:

## 2020-01-22 ENCOUNTER — Telehealth: Payer: Self-pay | Admitting: Pulmonary Disease

## 2020-01-22 NOTE — Telephone Encounter (Signed)
Called and spoke with pt letting her know that Dr. Francine Graven was able to see the results of the CT scan that she had performed at [facility] in 06/2019. Pt verbalized understanding. Nothing further needed.

## 2020-02-13 ENCOUNTER — Ambulatory Visit (INDEPENDENT_AMBULATORY_CARE_PROVIDER_SITE_OTHER): Payer: BC Managed Care – PPO | Admitting: Pulmonary Disease

## 2020-02-13 ENCOUNTER — Other Ambulatory Visit: Payer: Self-pay

## 2020-02-13 DIAGNOSIS — R0602 Shortness of breath: Secondary | ICD-10-CM

## 2020-02-13 LAB — PULMONARY FUNCTION TEST
DL/VA % pred: 121 %
DL/VA: 5.11 ml/min/mmHg/L
DLCO cor % pred: 95 %
DLCO cor: 20.48 ml/min/mmHg
DLCO unc % pred: 95 %
DLCO unc: 20.48 ml/min/mmHg
FEF 25-75 Post: 0.77 L/sec
FEF 25-75 Pre: 0.85 L/sec
FEF2575-%Change-Post: -9 %
FEF2575-%Pred-Post: 29 %
FEF2575-%Pred-Pre: 33 %
FEV1-%Change-Post: -3 %
FEV1-%Pred-Post: 58 %
FEV1-%Pred-Pre: 60 %
FEV1-Post: 1.61 L
FEV1-Pre: 1.67 L
FEV1FVC-%Change-Post: -4 %
FEV1FVC-%Pred-Pre: 81 %
FEV6-%Change-Post: 1 %
FEV6-%Pred-Post: 75 %
FEV6-%Pred-Pre: 74 %
FEV6-Post: 2.62 L
FEV6-Pre: 2.57 L
FEV6FVC-%Pred-Post: 103 %
FEV6FVC-%Pred-Pre: 103 %
FVC-%Change-Post: 0 %
FVC-%Pred-Post: 73 %
FVC-%Pred-Pre: 72 %
FVC-Post: 2.62 L
FVC-Pre: 2.6 L
Post FEV1/FVC ratio: 62 %
Post FEV6/FVC ratio: 100 %
Pre FEV1/FVC ratio: 64 %
Pre FEV6/FVC Ratio: 100 %
RV % pred: 80 %
RV: 1.61 L
TLC % pred: 81 %
TLC: 4.31 L

## 2020-02-13 NOTE — Progress Notes (Signed)
Full PFT performed today. °

## 2020-02-20 ENCOUNTER — Telehealth: Payer: Self-pay | Admitting: Pulmonary Disease

## 2020-02-20 NOTE — Telephone Encounter (Signed)
Spoke to patient, who is requesting PFT results today. She reviewed results via mychart and is very anxious about it.   Dr. Francine Graven, please advise. Thanks

## 2020-02-20 NOTE — Telephone Encounter (Signed)
Spoke with the patient and we discussed her PFT results. She is to continue her wixella inhaler and as needed albuterol. She will keep track of her symptoms and we can increase her wixella dosing if needed and then add LAMA therapy if she is symptomatic after maximizing her wixella.  Melody Comas, MD [place] [facility] Office: [phone]   See Amion for Pager Details

## 2020-02-21 NOTE — Telephone Encounter (Signed)
Noted  

## 2020-05-21 ENCOUNTER — Encounter: Payer: Self-pay | Admitting: Pulmonary Disease

## 2020-05-21 ENCOUNTER — Ambulatory Visit: Payer: BC Managed Care – PPO | Admitting: Pulmonary Disease

## 2020-05-21 ENCOUNTER — Other Ambulatory Visit: Payer: Self-pay

## 2020-05-21 VITALS — BP 126/68 | HR 108 | Temp 97.6°F | Ht 66.0 in | Wt 127.4 lb

## 2020-05-21 DIAGNOSIS — J449 Chronic obstructive pulmonary disease, unspecified: Secondary | ICD-10-CM

## 2020-05-21 MED ORDER — TRELEGY ELLIPTA 100-62.5-25 MCG/INH IN AEPB: 1.0000 | INHALATION_SPRAY | Freq: Every day | RESPIRATORY_TRACT | 0 refills | Status: DC

## 2020-05-21 MED ORDER — TRELEGY ELLIPTA 100-62.5-25 MCG/INH IN AEPB: 1.0000 | INHALATION_SPRAY | Freq: Every day | RESPIRATORY_TRACT | 6 refills | Status: DC

## 2020-05-21 NOTE — Patient Instructions (Signed)
Start trelegy ellipta inhaler 1 puff daily  Stop Wixella inhaler  Continue albuterol inhaler as needed 1-2 puffs every 4-6 hours for cough, wheezing, shortness of breath or chest tightness  Continue omeprazole for reflux

## 2020-05-21 NOTE — Progress Notes (Signed)
Synopsis: Referred by Angus Seller, NP for cough, dyspnea and pleural effusion in 12/2019  Subjective:   PATIENT ID: Karla [name] GENDER: female DOB: 11-15-1960, MRN: [id number]   HPI  Chief Complaint  Patient presents with  . Follow-up    Non productive cough which has increased today   Karla [name] is a 60 year old woman, non-smoker with anxiety/depression who returns to pulmonary clinic for asthma.   She has been doing well since last visit. She has been on wixella inhaler with intermittent shortness of breath and wheezing. She has been using albuterol as well and thinks she may be using it too much as her anxiety causes her shortness of breath as well which can lead to her taking the albuterol multiple times per day.   OV 01/21/20 She reports falling off a step stool and landing on a church pew in January suffering multiple rib fractures on her left with findings of a left pleural effusion. Thoracentesis was performed by IR with removal of of bloody fluid. No pleural fluid studies are located in the EMR. She then was ill with covid 19 pneumonia in late January where she developed cough and shortness of breath. The shortness of breath has persisted since then with some improvement. She was started on Wixella and albuterol in March for the dyspnea which helped at that time but she does not know if she is benefiting from the inhalers at this time. She has cut back to once daily dosing of the wixella and does not report any worsening in her symptoms. She reports using the albuterol multiple times per day and this has not changed since reducing her wixella dosing. She was started on omeprazole for the cough which she says has helped significantly, but she continues to cough at times. The cough is non-productive. She does complain of some nasal congestion and drainage.   Past Medical History:  Diagnosis Date  . ADD (attention deficit disorder)   . Anxiety   . Depression   .  Hyperlipidemia   . Hypothyroidism   . Insomnia   . Migraine   . Noncompliance with CPAP treatment   . OSA (obstructive sleep apnea)   . Snoring 04/21/2014     Family History  Problem Relation Age of Onset  . Asthma Father   . Diabetes Mother   . Heart disease Mother   . Skin cancer Paternal Grandmother   . Alcoholism Paternal Grandfather   . Prostate cancer Paternal Grandfather   . Lung cancer Maternal Grandfather      Social History   Socioeconomic History  . Marital status: Single    Spouse name: Not on file  . Number of children: Not on file  . Years of education: Not on file  . Highest education level: Not on file  Occupational History  . Not on file  Tobacco Use  . Smoking status: Never Smoker  . Smokeless tobacco: Never Used  Substance and Sexual Activity  . Alcohol use: Yes    Alcohol/week: 0.0 standard drinks    Comment: wine  . Drug use: No  . Sexual activity: Not on file  Other Topics Concern  . Not on file  Social History Narrative   Caffeine 2 drinks per day.  Divorced with no children, 2 step sons, 1 grandchild.  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     Social Determinants of Health   Financial Resource Strain: Not on file  Food Insecurity: Not on file  Transportation  Needs: Not on file  Physical Activity: Not on file  Stress: Not on file  Social Connections: Not on file  Intimate Partner Violence: Not on file     Allergies  Allergen Reactions  . Sulfa Antibiotics      Outpatient Medications Prior to Visit  Medication Sig Dispense Refill  . albuterol (PROVENTIL HFA;VENTOLIN HFA) 108 (90 BASE) MCG/ACT inhaler Inhale 2 puffs into the lungs every 6 (six) hours as needed for wheezing or shortness of breath.    . ALPRAZolam (XANAX) 1 MG tablet     . amphetamine-dextroamphetamine (ADDERALL XR) 30 MG 24 hr capsule Take 30 mg by mouth 2 (two) times daily.    . DULoxetine (CYMBALTA) 60 MG capsule Take 60 mg by mouth daily.    . fluticasone (FLONASE) 50  MCG/ACT nasal spray Place 1 spray into both nostrils daily.    Marland Kitchen ibuprofen (ADVIL) 600 MG tablet ibuprofen 600 mg tablet    . levothyroxine (SYNTHROID, LEVOTHROID) 50 MCG tablet   5  . mirtazapine (REMERON) 30 MG tablet Take 60 mg by mouth at bedtime.    Marland Kitchen omeprazole (PRILOSEC) 20 MG capsule Take 1 capsule (20 mg total) by mouth daily. 30 capsule 3  . WIXELA INHUB 250-50 MCG/DOSE AEPB Inhale 1 puff into the lungs 2 (two) times daily.     No facility-administered medications prior to visit.    Review of Systems  Constitutional: Negative for chills, fever, malaise/fatigue and weight loss.  HENT: Negative for congestion, sinus pain and sore throat.   Eyes: Negative.   Respiratory: Negative for cough, hemoptysis, sputum production, shortness of breath and wheezing.   Cardiovascular: Negative for chest pain, palpitations, orthopnea, claudication and leg swelling.  Gastrointestinal: Negative for abdominal pain, heartburn, nausea and vomiting.  Genitourinary: Negative.   Musculoskeletal: Negative for joint pain and myalgias.  Skin: Negative for rash.  Neurological: Negative for weakness.  Endo/Heme/Allergies: Negative.   Psychiatric/Behavioral: The patient is nervous/anxious.       Objective:   Vitals:   05/21/20 1630  BP: 126/68  Pulse: (!) 108  Temp: 97.6 F (36.4 C)  TempSrc: Other (Comment)  SpO2: 96%  Weight: 127 lb 6.4 oz (57.8 kg)  Height: 5\' 6"  (1.676 m)     Physical Exam Constitutional:      General: She is not in acute distress.    Appearance: She is not ill-appearing.  HENT:     Head: Normocephalic and atraumatic.  Eyes:     General: No scleral icterus.    Conjunctiva/sclera: Conjunctivae normal.     Pupils: Pupils are equal, round, and reactive to light.  Cardiovascular:     Rate and Rhythm: Normal rate and regular rhythm.     Pulses: Normal pulses.     Heart sounds: Normal heart sounds. No murmur heard.   Pulmonary:     Effort: Pulmonary effort is  normal.     Breath sounds: Normal breath sounds. No wheezing, rhonchi or rales.  Abdominal:     General: Bowel sounds are normal.     Palpations: Abdomen is soft.  Musculoskeletal:     Right lower leg: No edema.     Left lower leg: No edema.  Lymphadenopathy:     Cervical: No cervical adenopathy.  Skin:    General: Skin is warm and dry.  Neurological:     General: No focal deficit present.     Mental Status: She is alert.  Psychiatric:        Mood and  Affect: Mood normal.        Behavior: Behavior normal.        Thought Content: Thought content normal.        Judgment: Judgment normal.       CBC No results found for: WBC, RBC, HGB, HCT, PLT, MCV, MCH, MCHC, RDW, LYMPHSABS, MONOABS, EOSABS, BASOSABS   Chest imaging: CXR 01/21/20 The heart size and mediastinal contours are within normal limits. Left lateral lung base opacity is favored to relate to overlapping soft tissues given no correlate on the lateral radiograph. No visible pleural effusions. No pneumothorax. Remote left rib fractures. Dense acute osseous abnormality.  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   Her asthma does not appear as controlled as it should be on Wixella. We will change her to trelegy ellipta as she has moderately severe obstruction on her PFTs.   She is to continue albuterol as needed  and omprazole for GERD.   Follow up in 4 months.  Melody Comas, MD [facility] Office: [phone]   See Amion for Pager Details     Current Outpatient Medications:  .  albuterol (PROVENTIL HFA;VENTOLIN HFA) 108 (90 BASE) MCG/ACT inhaler, Inhale 2 puffs into the lungs every 6 (six) hours as needed for wheezing or shortness of breath., Disp: , Rfl:  .  ALPRAZolam (XANAX) 1 MG tablet, , Disp: , Rfl:  .  amphetamine-dextroamphetamine (ADDERALL XR) 30 MG 24 hr capsule, Take 30 mg by mouth 2 (two) times daily., Disp: , Rfl:  .  DULoxetine (CYMBALTA) 60 MG capsule, Take 60 mg by mouth daily., Disp: , Rfl:  .  fluticasone (FLONASE) 50 MCG/ACT nasal spray, Place 1 spray into both nostrils daily., Disp: , Rfl:  .  Fluticasone-Umeclidin-Vilant (TRELEGY ELLIPTA) 100-62.5-25 MCG/INH AEPB, Inhale 1 puff into the lungs daily., Disp: 28 each, Rfl: 6 .  Fluticasone-Umeclidin-Vilant (TRELEGY ELLIPTA) 100-62.5-25 MCG/INH AEPB, Inhale 1 puff into the lungs daily., Disp: 60 each, Rfl: 0 .  ibuprofen (ADVIL) 600 MG tablet, ibuprofen 600 mg tablet, Disp: , Rfl:  .  levothyroxine (SYNTHROID, LEVOTHROID) 50 MCG tablet, , Disp: , Rfl: 5 .  mirtazapine (REMERON) 30 MG tablet, Take 60 mg by mouth at bedtime., Disp: , Rfl:  .  omeprazole (PRILOSEC) 20 MG capsule, Take 1 capsule (20 mg total) by mouth daily., Disp: 30 capsule, Rfl: 3

## 2020-06-04 ENCOUNTER — Encounter: Payer: Self-pay | Admitting: Pulmonary Disease

## 2020-10-05 ENCOUNTER — Other Ambulatory Visit: Payer: Self-pay

## 2020-10-06 ENCOUNTER — Encounter: Payer: Self-pay | Admitting: Pulmonary Disease

## 2020-10-06 ENCOUNTER — Ambulatory Visit: Payer: BC Managed Care – PPO | Admitting: Pulmonary Disease

## 2020-10-06 VITALS — BP 128/82 | HR 106 | Ht 66.0 in | Wt 128.0 lb

## 2020-10-06 DIAGNOSIS — M26623 Arthralgia of bilateral temporomandibular joint: Secondary | ICD-10-CM | POA: Insufficient documentation

## 2020-10-06 DIAGNOSIS — U099 Post covid-19 condition, unspecified: Secondary | ICD-10-CM | POA: Insufficient documentation

## 2020-10-06 DIAGNOSIS — K047 Periapical abscess without sinus: Secondary | ICD-10-CM | POA: Insufficient documentation

## 2020-10-06 DIAGNOSIS — M8580 Other specified disorders of bone density and structure, unspecified site: Secondary | ICD-10-CM | POA: Insufficient documentation

## 2020-10-06 DIAGNOSIS — J449 Chronic obstructive pulmonary disease, unspecified: Secondary | ICD-10-CM | POA: Diagnosis not present

## 2020-10-06 DIAGNOSIS — Z87820 Personal history of traumatic brain injury: Secondary | ICD-10-CM | POA: Insufficient documentation

## 2020-10-06 DIAGNOSIS — R220 Localized swelling, mass and lump, head: Secondary | ICD-10-CM | POA: Insufficient documentation

## 2020-10-06 DIAGNOSIS — J019 Acute sinusitis, unspecified: Secondary | ICD-10-CM | POA: Insufficient documentation

## 2020-10-06 LAB — CBC WITH DIFFERENTIAL/PLATELET
Basophils Absolute: 0 10*3/uL (ref 0.0–0.1)
Basophils Relative: 0.7 % (ref 0.0–3.0)
Eosinophils Absolute: 0.3 10*3/uL (ref 0.0–0.7)
Eosinophils Relative: 3.9 % (ref 0.0–5.0)
HCT: 41.7 % (ref 36.0–46.0)
Hemoglobin: 14 g/dL (ref 12.0–15.0)
Lymphocytes Relative: 16.8 % (ref 12.0–46.0)
Lymphs Abs: 1.1 10*3/uL (ref 0.7–4.0)
MCHC: 33.6 g/dL (ref 30.0–36.0)
MCV: 89.1 fl (ref 78.0–100.0)
Monocytes Absolute: 0.5 10*3/uL (ref 0.1–1.0)
Monocytes Relative: 8 % (ref 3.0–12.0)
Neutro Abs: 4.6 10*3/uL (ref 1.4–7.7)
Neutrophils Relative %: 70.6 % (ref 43.0–77.0)
Platelets: 280 10*3/uL (ref 150.0–400.0)
RBC: 4.67 Mil/uL (ref 3.87–5.11)
RDW: 13.6 % (ref 11.5–15.5)
WBC: 6.5 10*3/uL (ref 4.0–10.5)

## 2020-10-06 NOTE — Patient Instructions (Signed)
Continue trelegy elipta 1 puff daily  Continue as needed albuterol 1-2 puffs as needed every 4-6 hours for shortness of breath, wheezing or chest tightness.   We will check blood work today, CBC with differential and IgE level, for your asthma.

## 2020-10-06 NOTE — Progress Notes (Signed)
Synopsis: Referred by Angus Seller, NP for cough, dyspnea and pleural effusion in 12/2019  Subjective:   PATIENT ID: Karla [name] GENDER: female DOB: [birth year], MRN: [id number]   HPI  Chief Complaint  Patient presents with   Follow-up    For asthma    Karla [name] is a 60 year old woman, non-smoker with anxiety/depression who returns to pulmonary clinic for asthma follow up.   She was started on trelegy ellipta 1 puff daily at last visit with good effect on her breathing. She reports rarely needing her albuterol inhaler. She does report some sinus infections since last visit which have affected her breathing at times. She is being referred to [facility] for allergy evaluation.   She is currently on summer break from teaching school.  Past Medical History:  Diagnosis Date   ADD (attention deficit disorder)    Anxiety    Depression    Hyperlipidemia    Hypothyroidism    Insomnia    Migraine    Noncompliance with CPAP treatment    OSA (obstructive sleep apnea)    Snoring 04/21/2014     Family History  Problem Relation Age of Onset   Asthma Father    Diabetes Mother    Heart disease Mother    Skin cancer Paternal Grandmother    Alcoholism Paternal Grandfather    Prostate cancer Paternal Grandfather    Lung cancer Maternal Grandfather      Social History   Socioeconomic History   Marital status: Single    Spouse name: Not on file   Number of children: Not on file   Years of education: Not on file   Highest education level: Not on file  Occupational History   Not on file  Tobacco Use   Smoking status: Never   Smokeless tobacco: Never  Substance and Sexual Activity   Alcohol use: Yes    Alcohol/week: 0.0 standard drinks    Comment: wine   Drug use: No   Sexual activity: Not on file  Other Topics Concern   Not on file  Social History Narrative   Caffeine 2 drinks per day.  Divorced with no children, 2 step sons, 1 grandchild.   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     Social Determinants of Health   Financial Resource Strain: Not on file  Food Insecurity: Not on file  Transportation Needs: Not on file  Physical Activity: Not on file  Stress: Not on file  Social Connections: Not on file  Intimate Partner Violence: Not on file     Allergies  Allergen Reactions   Bupropion Other (See Comments)   Sulfa Antibiotics      Outpatient Medications Prior to Visit  Medication Sig Dispense Refill   albuterol (PROVENTIL HFA;VENTOLIN HFA) 108 (90 BASE) MCG/ACT inhaler Inhale 2 puffs into the lungs every 6 (six) hours as needed for wheezing or shortness of breath.     ALPRAZolam (XANAX) 1 MG tablet      amphetamine-dextroamphetamine (ADDERALL XR) 30 MG 24 hr capsule Take 30 mg by mouth 2 (two) times daily.     DULoxetine (CYMBALTA) 60 MG capsule Take 60 mg by mouth daily.     fluticasone (FLONASE) 50 MCG/ACT nasal spray Place 1 spray into both nostrils daily.     Fluticasone-Umeclidin-Vilant (TRELEGY ELLIPTA) 100-62.5-25 MCG/INH AEPB Inhale 1 puff into the lungs daily. 28 each 6   Fluticasone-Umeclidin-Vilant (TRELEGY ELLIPTA) 100-62.5-25 MCG/INH AEPB Inhale 1 puff into the lungs daily.  60 each 0   ibuprofen (ADVIL) 600 MG tablet ibuprofen 600 mg tablet     levothyroxine (SYNTHROID, LEVOTHROID) 50 MCG tablet   5   mirtazapine (REMERON) 30 MG tablet Take 60 mg by mouth at bedtime.     NONFORMULARY OR COMPOUNDED ITEM Doterra essential oils     omeprazole (PRILOSEC) 20 MG capsule Take 1 capsule (20 mg total) by mouth daily. 30 capsule 3   amoxicillin (AMOXIL) 500 MG capsule Take 500 mg by mouth 3 (three) times daily.     No facility-administered medications prior to visit.    Review of Systems  Constitutional:  Negative for chills, fever, malaise/fatigue and weight loss.  HENT:  Positive for congestion. Negative for sinus pain and sore throat.   Eyes: Negative.   Respiratory:  Negative for cough, hemoptysis, sputum  production, shortness of breath and wheezing.   Cardiovascular:  Negative for chest pain, palpitations, orthopnea, claudication and leg swelling.  Gastrointestinal:  Negative for abdominal pain, heartburn, nausea and vomiting.  Genitourinary: Negative.   Musculoskeletal:  Negative for joint pain and myalgias.  Skin:  Negative for rash.  Neurological:  Negative for weakness.  Endo/Heme/Allergies: Negative.      Objective:   Vitals:   10/06/20 1123  BP: 128/82  Pulse: (!) 106  SpO2: 99%  Weight: 128 lb (58.1 kg)  Height: 5\' 6"  (1.676 m)     Physical Exam Constitutional:      General: She is not in acute distress.    Appearance: She is not ill-appearing.  HENT:     Head: Normocephalic and atraumatic.     Mouth/Throat:     Mouth: Mucous membranes are moist.     Pharynx: Oropharynx is clear.  Eyes:     General: No scleral icterus.    Conjunctiva/sclera: Conjunctivae normal.     Pupils: Pupils are equal, round, and reactive to light.  Cardiovascular:     Rate and Rhythm: Normal rate and regular rhythm.     Pulses: Normal pulses.     Heart sounds: Normal heart sounds. No murmur heard. Pulmonary:     Effort: Pulmonary effort is normal.     Breath sounds: Normal breath sounds. No wheezing, rhonchi or rales.  Abdominal:     General: Bowel sounds are normal.     Palpations: Abdomen is soft.  Musculoskeletal:     Right lower leg: No edema.     Left lower leg: No edema.  Lymphadenopathy:     Cervical: No cervical adenopathy.  Skin:    General: Skin is warm and dry.  Neurological:     General: No focal deficit present.     Mental Status: She is alert.  Psychiatric:        Mood and Affect: Mood normal.        Behavior: Behavior normal.        Thought Content: Thought content normal.        Judgment: Judgment normal.      CBC    Component Value Date/Time   WBC 6.5 10/06/2020 1218   RBC 4.67 10/06/2020 1218   HGB 14.0 10/06/2020 1218   HCT 41.7 10/06/2020 1218    PLT 280.0 10/06/2020 1218   MCV 89.1 10/06/2020 1218   MCHC 33.6 10/06/2020 1218   RDW 13.6 10/06/2020 1218   LYMPHSABS 1.1 10/06/2020 1218   MONOABS 0.5 10/06/2020 1218   EOSABS 0.3 10/06/2020 1218   BASOSABS 0.0 10/06/2020 1218   Chest imaging: CXR 01/21/20 The  heart size and mediastinal contours are within normal limits. Left lateral lung base opacity is favored to relate to overlapping soft tissues given no correlate on the lateral radiograph. No visible pleural effusions. No pneumothorax. Remote left rib fractures. Dense acute osseous abnormality.  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   Her asthma appears controlled at this time with Trelegy ellipta 1 puff daily and as needed albuterol.   CBC shows absolute eosinophil count of 300 and normal IgE level.   Follow up in 6 months.  Karla Comas, MD [facility] Office: [phone]     Current Outpatient Medications:    albuterol (PROVENTIL HFA;VENTOLIN HFA) 108 (90 BASE) MCG/ACT inhaler, Inhale 2 puffs into the lungs every 6 (six) hours as needed for wheezing or shortness of breath., Disp: , Rfl:    ALPRAZolam (XANAX) 1 MG tablet, , Disp: , Rfl:    amphetamine-dextroamphetamine (ADDERALL XR) 30 MG 24 hr  capsule, Take 30 mg by mouth 2 (two) times daily., Disp: , Rfl:    DULoxetine (CYMBALTA) 60 MG capsule, Take 60 mg by mouth daily., Disp: , Rfl:    fluticasone (FLONASE) 50 MCG/ACT nasal spray, Place 1 spray into both nostrils daily., Disp: , Rfl:    Fluticasone-Umeclidin-Vilant (TRELEGY ELLIPTA) 100-62.5-25 MCG/INH AEPB, Inhale 1 puff into the lungs daily., Disp: 28 each, Rfl: 6   Fluticasone-Umeclidin-Vilant (TRELEGY ELLIPTA) 100-62.5-25 MCG/INH AEPB, Inhale 1 puff into the lungs daily., Disp: 60 each, Rfl: 0   ibuprofen (ADVIL) 600 MG tablet, ibuprofen 600 mg tablet, Disp: , Rfl:    levothyroxine (SYNTHROID, LEVOTHROID) 50 MCG tablet, , Disp: , Rfl: 5   mirtazapine (REMERON) 30 MG tablet, Take 60 mg by mouth at bedtime., Disp: , Rfl:    NONFORMULARY OR COMPOUNDED ITEM, Doterra essential oils, Disp: , Rfl:    omeprazole (PRILOSEC) 20 MG capsule, Take 1 capsule (20 mg total) by mouth daily., Disp: 30 capsule, Rfl: 3   amoxicillin (AMOXIL) 500 MG capsule, Take 500 mg by mouth 3 (three) times daily., Disp: , Rfl:

## 2020-10-07 LAB — IGE: IgE (Immunoglobulin E), Serum: 36 kU/L (ref <=114)

## 2021-01-30 ENCOUNTER — Other Ambulatory Visit: Payer: Self-pay | Admitting: Pulmonary Disease

## 2021-03-15 ENCOUNTER — Other Ambulatory Visit: Payer: Self-pay | Admitting: Internal Medicine

## 2021-03-15 DIAGNOSIS — Z1231 Encounter for screening mammogram for malignant neoplasm of breast: Secondary | ICD-10-CM

## 2021-04-30 ENCOUNTER — Ambulatory Visit: Payer: BC Managed Care – PPO

## 2021-05-28 ENCOUNTER — Other Ambulatory Visit: Payer: Self-pay

## 2021-05-28 ENCOUNTER — Encounter: Payer: Self-pay | Admitting: Pulmonary Disease

## 2021-05-28 ENCOUNTER — Ambulatory Visit: Payer: BC Managed Care – PPO | Admitting: Pulmonary Disease

## 2021-05-28 ENCOUNTER — Ambulatory Visit
Admission: RE | Admit: 2021-05-28 | Discharge: 2021-05-28 | Disposition: A | Discharge status: Home / Self Care | Payer: BC Managed Care – PPO | Source: Ambulatory Visit | Attending: Internal Medicine | Admitting: Internal Medicine

## 2021-05-28 VITALS — BP 128/80 | HR 109 | Ht 66.0 in | Wt 127.0 lb

## 2021-05-28 DIAGNOSIS — J449 Chronic obstructive pulmonary disease, unspecified: Secondary | ICD-10-CM

## 2021-05-28 DIAGNOSIS — Z1231 Encounter for screening mammogram for malignant neoplasm of breast: Secondary | ICD-10-CM

## 2021-05-28 NOTE — Patient Instructions (Addendum)
Continue trelegy ellipta 1 puff daily  Continue as needed use of albuterol  Schedule pulmonary function tests in the next couple of weeks at your convenience and we will message you with the results.   Follow up in 1 year

## 2021-05-28 NOTE — Progress Notes (Signed)
Synopsis: Referred by Angus Seller, NP for cough, dyspnea and pleural effusion in 12/2019  Subjective:   PATIENT ID: Karla [name] GENDER: female DOB: 1960-07-13, MRN: [id number]   HPI  Chief Complaint  Patient presents with   Follow-up    F/U for asthma. Still has some SOB, especially with exertion at work.     Karla [name] is a 61 year old woman, non-smoker with anxiety/depression who returns to pulmonary clinic for asthma follow up.   She has been doing well on trelegy ellipta 1 puff daily. She uses albuterol as needed occasionally. She is now working a reduced work schedule due to on going brain fog and fatigue since having covid 19. She continues to have some exertional dyspnea.   Her reflux symptoms are improved with elevated the head of her bed.   Past Medical History:  Diagnosis Date   ADD (attention deficit disorder)    Anxiety    Depression    Hyperlipidemia    Hypothyroidism    Insomnia    Migraine    Noncompliance with CPAP treatment    OSA (obstructive sleep apnea)    Snoring 04/21/2014     Family History  Problem Relation Age of Onset   Asthma Father    Diabetes Mother    Heart disease Mother    Skin cancer Paternal Grandmother    Alcoholism Paternal Grandfather    Prostate cancer Paternal Grandfather    Lung cancer Maternal Grandfather      Social History   Socioeconomic History   Marital status: Single    Spouse name: Not on file   Number of children: Not on file   Years of education: Not on file   Highest education level: Not on file  Occupational History   Not on file  Tobacco Use   Smoking status: Never   Smokeless tobacco: Never  Substance and Sexual Activity   Alcohol use: Yes    Alcohol/week: 0.0 standard drinks    Comment: wine   Drug use: No   Sexual activity: Not on file  Other Topics Concern   Not on file  Social History Narrative   Caffeine 2 drinks per day.  Divorced with no children, 2 step sons, 1 grandchild.   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     Social Determinants of Health   Financial Resource Strain: Not on file  Food Insecurity: Not on file  Transportation Needs: Not on file  Physical Activity: Not on file  Stress: Not on file  Social Connections: Not on file  Intimate Partner Violence: Not on file     Allergies  Allergen Reactions   Bupropion Other (See Comments)   Sulfa Antibiotics      Outpatient Medications Prior to Visit  Medication Sig Dispense Refill   albuterol (PROVENTIL HFA;VENTOLIN HFA) 108 (90 BASE) MCG/ACT inhaler Inhale 2 puffs into the lungs every 6 (six) hours as needed for wheezing or shortness of breath.     ALPRAZolam (XANAX) 1 MG tablet      amphetamine-dextroamphetamine (ADDERALL) 10 MG tablet Take 5-10 mg by mouth daily.     DULoxetine (CYMBALTA) 60 MG capsule Take 60 mg by mouth daily.     fluticasone (FLONASE) 50 MCG/ACT nasal spray Place 1 spray into both nostrils daily.     ibuprofen (ADVIL) 600 MG tablet ibuprofen 600 mg tablet     levothyroxine (SYNTHROID, LEVOTHROID) 50 MCG tablet   5   mirtazapine (REMERON) 30 MG tablet Take 60 mg  by mouth at bedtime.     MYDAYIS 50 MG CP24 Take 1 capsule by mouth daily.     NONFORMULARY OR COMPOUNDED ITEM Doterra essential oils     omeprazole (PRILOSEC) 20 MG capsule Take 1 capsule (20 mg total) by mouth daily. 30 capsule 3   TRELEGY ELLIPTA 100-62.5-25 MCG/INH AEPB TAKE 1 PUFF BY MOUTH EVERY DAY 60 each 6   amoxicillin (AMOXIL) 500 MG capsule Take 500 mg by mouth 3 (three) times daily.     amphetamine-dextroamphetamine (ADDERALL XR) 30 MG 24 hr capsule Take 30 mg by mouth 2 (two) times daily.     Fluticasone-Umeclidin-Vilant (TRELEGY ELLIPTA) 100-62.5-25 MCG/INH AEPB Inhale 1 puff into the lungs daily. 60 each 0   No facility-administered medications prior to visit.   Review of Systems  Constitutional:  Negative for chills, fever, malaise/fatigue and weight loss.  HENT:  Negative for congestion, sinus pain and  sore throat.   Eyes: Negative.   Respiratory:  Positive for shortness of breath. Negative for cough, hemoptysis, sputum production and wheezing.   Cardiovascular:  Negative for chest pain, palpitations, orthopnea, claudication and leg swelling.  Gastrointestinal:  Negative for abdominal pain, heartburn, nausea and vomiting.  Genitourinary: Negative.   Musculoskeletal:  Negative for joint pain and myalgias.  Skin:  Negative for rash.  Neurological:  Negative for weakness.  Endo/Heme/Allergies: Negative.    Objective:   Vitals:   05/28/21 1040  BP: 128/80  Pulse: (!) 109  SpO2: 99%  Weight: 127 lb (57.6 kg)  Height: 5\' 6"  (1.676 m)   Physical Exam Constitutional:      General: She is not in acute distress.    Appearance: She is not ill-appearing.  HENT:     Head: Normocephalic and atraumatic.  Eyes:     General: No scleral icterus.    Conjunctiva/sclera: Conjunctivae normal.  Cardiovascular:     Rate and Rhythm: Normal rate and regular rhythm.     Pulses: Normal pulses.     Heart sounds: Normal heart sounds. No murmur heard. Pulmonary:     Effort: Pulmonary effort is normal.     Breath sounds: Normal breath sounds. No wheezing, rhonchi or rales.  Musculoskeletal:     Right lower leg: No edema.     Left lower leg: No edema.  Skin:    General: Skin is warm and dry.  Neurological:     General: No focal deficit present.     Mental Status: She is alert.   CBC    Component Value Date/Time   WBC 6.5 10/06/2020 1218   RBC 4.67 10/06/2020 1218   HGB 14.0 10/06/2020 1218   HCT 41.7 10/06/2020 1218   PLT 280.0 10/06/2020 1218   MCV 89.1 10/06/2020 1218   MCHC 33.6 10/06/2020 1218   RDW 13.6 10/06/2020 1218   LYMPHSABS 1.1 10/06/2020 1218   MONOABS 0.5 10/06/2020 1218   EOSABS 0.3 10/06/2020 1218   BASOSABS 0.0 10/06/2020 1218   Chest imaging: CXR 01/21/20 The heart size and mediastinal contours are within normal limits. Left lateral lung base opacity is favored to  relate to overlapping soft tissues given no correlate on the lateral radiograph. No visible pleural effusions. No pneumothorax. Remote left rib fractures. Dense acute osseous abnormality.  PFT: PFT Results Latest Ref Rng & Units 02/13/2020  FVC-Pre L 2.60  FVC-Predicted Pre % 72  FVC-Post L 2.62  FVC-Predicted Post % 73  Pre FEV1/FVC % % 64  Post FEV1/FCV % % 62  FEV1-Pre  L 1.67  FEV1-Predicted Pre % 60  FEV1-Post L 1.61  DLCO uncorrected ml/min/mmHg 20.48  DLCO UNC% % 95  DLCO corrected ml/min/mmHg 20.48  DLCO COR %Predicted % 95  DLVA Predicted % 121  TLC L 4.31  TLC % Predicted % 81  RV % Predicted % 80     Assessment & Plan:   Asthma with chronic obstructive pulmonary disease (COPD) (HCC)  Discussion: Karla NuttingCheryl [name] is a 61 year old woman, non-smoker with anxiety/depression who returns to pulmonary clinic for asthma follow up.   Her asthma appears controlled at this time with Trelegy ellipta 1 puff daily and as needed albuterol.   CBC has shown absolute eosinophil count of 300 and normal IgE level in the past  We will check pulmonary function tests in the near future and message her with the results  Follow up in 1 year.  Melody ComasJonathan Randi Poullard, MD [facility] Office: [phone]     Current Outpatient Medications:    albuterol (PROVENTIL HFA;VENTOLIN HFA) 108 (90 BASE) MCG/ACT inhaler, Inhale 2 puffs into the lungs every 6 (six) hours as needed for wheezing or shortness of breath., Disp: , Rfl:    ALPRAZolam (XANAX) 1 MG tablet, , Disp: , Rfl:    amphetamine-dextroamphetamine (ADDERALL) 10 MG tablet, Take 5-10 mg by mouth daily., Disp: , Rfl:    DULoxetine (CYMBALTA) 60 MG capsule, Take 60 mg by mouth daily., Disp: , Rfl:    fluticasone (FLONASE) 50 MCG/ACT nasal spray, Place 1 spray into both nostrils daily., Disp: , Rfl:    ibuprofen (ADVIL) 600 MG tablet, ibuprofen 600 mg tablet, Disp: , Rfl:    levothyroxine (SYNTHROID, LEVOTHROID) 50 MCG  tablet, , Disp: , Rfl: 5   mirtazapine (REMERON) 30 MG tablet, Take 60 mg by mouth at bedtime., Disp: , Rfl:    MYDAYIS 50 MG CP24, Take 1 capsule by mouth daily., Disp: , Rfl:    NONFORMULARY OR COMPOUNDED ITEM, Doterra essential oils, Disp: , Rfl:    omeprazole (PRILOSEC) 20 MG capsule, Take 1 capsule (20 mg total) by mouth daily., Disp: 30 capsule, Rfl: 3   TRELEGY ELLIPTA 100-62.5-25 MCG/INH AEPB, TAKE 1 PUFF BY MOUTH EVERY DAY, Disp: 60 each, Rfl: 6

## 2021-07-09 ENCOUNTER — Ambulatory Visit (INDEPENDENT_AMBULATORY_CARE_PROVIDER_SITE_OTHER): Payer: BC Managed Care – PPO | Admitting: Pulmonary Disease

## 2021-07-09 ENCOUNTER — Other Ambulatory Visit: Payer: Self-pay

## 2021-07-09 DIAGNOSIS — J449 Chronic obstructive pulmonary disease, unspecified: Secondary | ICD-10-CM

## 2021-07-09 LAB — PULMONARY FUNCTION TEST
DL/VA % pred: 124 %
DL/VA: 5.19 ml/min/mmHg/L
DLCO cor % pred: 48 %
DLCO cor: 10.31 ml/min/mmHg
DLCO unc % pred: 48 %
DLCO unc: 10.31 ml/min/mmHg
FEF 25-75 Post: 2.19 L/sec
FEF 25-75 Pre: 2.66 L/sec
FEF2575-%Change-Post: -17 %
FEF2575-%Pred-Post: 88 %
FEF2575-%Pred-Pre: 107 %
FEV1-%Change-Post: -3 %
FEV1-%Pred-Post: 77 %
FEV1-%Pred-Pre: 80 %
FEV1-Post: 2.11 L
FEV1-Pre: 2.19 L
FEV1FVC-%Change-Post: -6 %
FEV1FVC-%Pred-Pre: 106 %
FEV6-%Change-Post: 1 %
FEV6-%Pred-Post: 78 %
FEV6-%Pred-Pre: 77 %
FEV6-Post: 2.67 L
FEV6-Pre: 2.62 L
FEV6FVC-%Change-Post: -1 %
FEV6FVC-%Pred-Post: 102 %
FEV6FVC-%Pred-Pre: 103 %
FVC-%Change-Post: 3 %
FVC-%Pred-Post: 76 %
FVC-%Pred-Pre: 74 %
FVC-Post: 2.7 L
FVC-Pre: 2.62 L
Post FEV1/FVC ratio: 78 %
Post FEV6/FVC ratio: 99 %
Pre FEV1/FVC ratio: 84 %
Pre FEV6/FVC Ratio: 100 %
RV % pred: 108 %
RV: 2.22 L
TLC % pred: 109 %
TLC: 5.8 L

## 2021-07-09 NOTE — Progress Notes (Signed)
Full PFT completed today ? ?

## 2021-07-12 ENCOUNTER — Other Ambulatory Visit: Payer: Self-pay | Admitting: Family Medicine

## 2021-07-12 ENCOUNTER — Other Ambulatory Visit: Payer: Self-pay

## 2021-07-12 ENCOUNTER — Ambulatory Visit: Payer: Self-pay

## 2021-07-12 DIAGNOSIS — M545 Low back pain, unspecified: Secondary | ICD-10-CM

## 2021-07-16 ENCOUNTER — Telehealth: Payer: Self-pay | Admitting: Pulmonary Disease

## 2021-07-16 DIAGNOSIS — R942 Abnormal results of pulmonary function studies: Secondary | ICD-10-CM

## 2021-07-16 NOTE — Telephone Encounter (Signed)
Please let patient know that her PFTs show a decline in her diffusion capacity, meaning oxygen is not traveling through the lung tissue as easily into the blood vessels.  ? ?I have ordered a HRCT chest scan for further evaluation of her lung tissue and airways. ? ?Thanks, ?JD ?

## 2021-07-19 NOTE — Telephone Encounter (Signed)
Called and spoke with patient. She verbalized understanding of results. She was able to see her PFT results in MyChart. She wants to know if she has "full blown COPD"?  ? ? ?JD, can you please advise? Thanks!  ?

## 2021-07-19 NOTE — Telephone Encounter (Signed)
Patient would like results of PFT. Patient phone number is [phone]. ?

## 2021-07-22 NOTE — Telephone Encounter (Signed)
The non-specific pulmonary function pattern can be seen in COPD.  ? ?The reduced diffusion capacity, which is oxygen's ability to travel through the lung tissue and blood vessel walls into the blood cells is significantly reduced.  ? ?Please order a HRCT Chest scan to take a better look at her lungs and airways. ? ?Thanks, ?JD ?

## 2021-07-29 ENCOUNTER — Ambulatory Visit (INDEPENDENT_AMBULATORY_CARE_PROVIDER_SITE_OTHER)
Admission: RE | Admit: 2021-07-29 | Discharge: 2021-07-29 | Disposition: A | Discharge status: Home / Self Care | Payer: BC Managed Care – PPO | Source: Ambulatory Visit | Attending: Pulmonary Disease | Admitting: Pulmonary Disease

## 2021-07-29 DIAGNOSIS — R942 Abnormal results of pulmonary function studies: Secondary | ICD-10-CM

## 2021-08-04 ENCOUNTER — Other Ambulatory Visit: Payer: BC Managed Care – PPO

## 2021-10-04 ENCOUNTER — Other Ambulatory Visit: Payer: Self-pay | Admitting: Pulmonary Disease

## 2022-02-15 ENCOUNTER — Encounter: Payer: Self-pay | Admitting: Registered"

## 2022-02-15 ENCOUNTER — Encounter: Payer: BC Managed Care – PPO | Attending: Internal Medicine | Admitting: Registered"

## 2022-02-15 DIAGNOSIS — M797 Fibromyalgia: Secondary | ICD-10-CM | POA: Insufficient documentation

## 2022-02-15 DIAGNOSIS — Z8249 Family history of ischemic heart disease and other diseases of the circulatory system: Secondary | ICD-10-CM | POA: Diagnosis not present

## 2022-02-15 DIAGNOSIS — R7301 Impaired fasting glucose: Secondary | ICD-10-CM | POA: Diagnosis present

## 2022-02-15 DIAGNOSIS — Z713 Dietary counseling and surveillance: Secondary | ICD-10-CM | POA: Insufficient documentation

## 2022-02-15 DIAGNOSIS — Z833 Family history of diabetes mellitus: Secondary | ICD-10-CM | POA: Diagnosis not present

## 2022-02-15 NOTE — Patient Instructions (Signed)
-   Aim to balance snacks with carbohydrates + protein.   - Aim to have balanced lunch daily.   - Have 1/2 plate of non-starchy vegetables with lunch and dinner.   - Checkout [email or web address] for recipes.

## 2022-02-15 NOTE — Progress Notes (Signed)
Medical Nutrition Therapy  Appointment Start time:  11:03  Appointment End time:  12:20  Primary concerns today: wants quick and easy recipes  Referral diagnosis: impaired fasting glucose Preferred learning style: no preference indicated Learning readiness: change in progress   NUTRITION ASSESSMENT   Pt arrives stating her A1c increased from 5.7 to 6.4, with most recent A1c 6.3. Reports she had a traumatic fall in 04/2019 and a few weeks later had COVID. 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 Reports since then ostopenia developed into osteoporosis along with new diagnosis of stage 2 COPD and asthma as a result of COVID. States initially she tried not to be prescribed metformin but now taking it. States she has also had an increase in medications from 3 to 13 medications. States she will have A1c checked again in 05/2022.  States she has a strong family history of Type 2 diabetes due to mom, 2 maternal aunts, and maternal grandmother. States she grew up seeing an aunt and grandmother using insulin. States she has also had a few family members to experience amputation due to diabetes-related complications. Reports history of Cardiovascular disease on paternal side of family and asked to be prescribed rosuvastatin.   States she is currently tapering from cymbalta, which has helped her fibromyalgia. States she has had more cavities and teeth challenges which she feel is linked to medication. States she is having withdrawals which causes nausea at times. Was nauseous most of the day yesterday.   States she walks during the week and likes having a walking buddy. Wants to be motivated enough to walk when unable to walk with someone else.   States she makes egg bites: eggs, cheese, vegetables in the [employer]. States she has tried to go through about 6 tsp of sugar a day and has tried to reduce sugar intake. Prefers "real" bacon over [place] bacon. States she loves bread but doesn't buy it much. States she goes  lengths of times without eating during the day which has been more challenging since not being on a schedule while out of work on [employer]. States she eats late around 8 pm and stays up later until 12 am. States she does not like salmon. States she is willing to try to eat sea bass. States she doesn't want to spend time cooking, lives along, and does not like leftovers. States she wants easy, quick, and simple meals.     Clinical Medical Hx: fibromyalgia Medications: See list Labs: recent A1c elevated (6.3), elevated Chol (202), elevated LDL Chol (130) Notable Signs/Symptoms: increased thirst due to medication usage, neuropathy in her feet sometimes which states it may be related to fibromyalgia (happens about once every few months)  Lifestyle & Dietary Hx   Estimated daily fluid intake: 80 oz Supplements: See list  Sleep: 6-7.5 hrs/night Stress / self-care: walking, hangs out with friends, hobbies Current average weekly physical activity: walking 45-55 min, 2x/week  24-Hr Dietary Recall First Meal (9 am): bowl of oatmeal + fruit + walnuts + TBS PB or egg bites or Kodiak power waffles or bacon Snack:  Second Meal: apple or nuts or 2-3 slices of flatbread pizza or salad or protein bar Snack:  Third Meal (8 pm): marinated cubed chicken + broccoli + cubed potatoes/corn  Snack: ice cream or protein bar Beverages: unsweet tea, water   NUTRITION DIAGNOSIS  NB-1.1 Food and nutrition-related knowledge deficit As related to prediabetes.  As evidenced by pt verbalizes some incomplete nutrition-related education.   NUTRITION INTERVENTION  Nutrition  education (E-1) on the following topics: Pt was educated and counseled on prediabetes/diabetes risk factors, A1c ranges for prediabetes/diabetes, role of fiber in eating regimen, and importance of physical activity. Discussed meal/snack planning and how to balance meals/snacks using MyPlate for prediabetes. Pt agreed with goals listed.  Handouts  Provided Include  MyPlate for Prediabetes  Learning Style & Readiness for Change Teaching method utilized: Visual & Auditory  Demonstrated degree of understanding via: Teach Back  Barriers to learning/adherence to lifestyle change: action stage of change  Goals Established by Pt Aim to balance snacks with carbohydrates + protein.  Aim to have balanced lunch daily.  Have 1/2 plate of non-starchy vegetables with lunch and dinner.  Checkout www.[email or web address].no for recipes.    MONITORING & EVALUATION Dietary intake, weekly physical activity.  Next Steps  Patient is to follow-up prn.

## 2022-03-02 ENCOUNTER — Telehealth: Payer: Self-pay | Admitting: Pulmonary Disease

## 2022-03-02 DIAGNOSIS — Z23 Encounter for immunization: Secondary | ICD-10-CM

## 2022-03-02 NOTE — Telephone Encounter (Signed)
Yes, ok for her to get the RSV vaccine.  Thanks, JD

## 2022-03-02 NOTE — Telephone Encounter (Signed)
Pt called the office stating that she tried to get the RSV vaccine but due to her not being 61, they would not give it to her without an Rx. Dr. Francine Graven, please advise on this if you are okay writing an Rx for pt so she can be able to get the vaccine.

## 2022-03-02 NOTE — Telephone Encounter (Signed)
Called and spoke to patient and advised her that I was sending the rx into her pharmacy as requested. Nothing further needed

## 2022-05-31 ENCOUNTER — Encounter: Payer: Self-pay | Admitting: Pulmonary Disease

## 2022-05-31 ENCOUNTER — Ambulatory Visit: Payer: BC Managed Care – PPO | Admitting: Pulmonary Disease

## 2022-05-31 VITALS — BP 128/78 | HR 103 | Ht 65.0 in | Wt 118.6 lb

## 2022-05-31 DIAGNOSIS — J4489 Other specified chronic obstructive pulmonary disease: Secondary | ICD-10-CM | POA: Diagnosis not present

## 2022-05-31 NOTE — Patient Instructions (Addendum)
Continue trelegy 1 puff daily  Continue albuterol 1-2 puffs every 4-6 hours as needed  Try using the albuterol before getting on the airplane, consider wearing a mask while on the plane to prevent illness.   Call us if having trouble with the breathing and we will consider starting a biologic therapy  Follow up in 1 year

## 2022-05-31 NOTE — Progress Notes (Signed)
Synopsis: Referred by Lazaro Arms, NP for cough, dyspnea and pleural effusion in 12/2019  Subjective:   PATIENT ID: Karla [name] GENDER: female DOB: 12/31/1960, MRN: [id number]  HPI  Chief Complaint  Patient presents with   Follow-up    Pt reports her breathing is fine, she reports that she has had some exacerbation symps after flying. She reports she is still having some SOB, coughing, throat clearing   Karla [name] is a 62 year old woman, non-smoker with anxiety/depression who returns to pulmonary clinic for asthma follow up.   She has been doing well since last visit. She retired 03/25/22. She has started doing Yoga. She is using trelegy 1 puff daily. She reports trouble with her breathing after flying recently. She received prednisone after the second time this occurred which helped.  OV 05/28/21 She has been doing well on trelegy ellipta 1 puff daily. She uses albuterol as needed occasionally. She is now working a reduced work schedule due to on going brain fog and fatigue since having covid 19. She continues to have some exertional dyspnea.   Her reflux symptoms are improved with elevated the head of her bed.   Past Medical History:  Diagnosis Date   ADD (attention deficit disorder)    Anxiety    COPD (chronic obstructive pulmonary disease) (HCC)    Depression    Hyperlipidemia    Hypothyroidism    Insomnia    Migraine    Noncompliance with CPAP treatment    OSA (obstructive sleep apnea)    Snoring 04/21/2014     Family History  Problem Relation Age of Onset   Diabetes Mother    Heart disease Mother    Asthma Father    Lung cancer Maternal Grandfather    Skin cancer Paternal Grandmother    Alcoholism Paternal Grandfather    Prostate cancer Paternal Grandfather    Hypertension Other      Social History   Socioeconomic History   Marital status: Single    Spouse name: Not on file   Number of children: Not on file   Years of education: Not on file    Highest education level: Not on file  Occupational History   Not on file  Tobacco Use   Smoking status: Never   Smokeless tobacco: Never  Substance and Sexual Activity   Alcohol use: Yes    Alcohol/week: 0.0 standard drinks of alcohol    Comment: wine   Drug use: No   Sexual activity: Not on file  Other Topics Concern   Not on file  Social History Narrative   Caffeine 2 drinks per day.  Divorced with no children, 2 step sons, 1 grandchild.  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     Social Determinants of Health   Financial Resource Strain: Not on file  Food Insecurity: Not on file  Transportation Needs: Not on file  Physical Activity: Not on file  Stress: Not on file  Social Connections: Not on file  Intimate Partner Violence: Not on file     Allergies  Allergen Reactions   Bupropion Other (See Comments)   Sulfa Antibiotics      Outpatient Medications Prior to Visit  Medication Sig Dispense Refill   albuterol (PROVENTIL HFA;VENTOLIN HFA) 108 (90 BASE) MCG/ACT inhaler Inhale 2 puffs into the lungs every 6 (six) hours as needed for wheezing or shortness of breath.     ALPRAZolam (XANAX) 1 MG tablet      amphetamine-dextroamphetamine (ADDERALL) 10 MG  tablet Take 5-10 mg by mouth daily.     benzonatate (TESSALON) 100 MG capsule Take 100 mg by mouth 3 (three) times daily as needed.     fexofenadine (ALLEGRA) 180 MG tablet Take 180 mg by mouth daily.     fluticasone (FLONASE) 50 MCG/ACT nasal spray Place 1 spray into both nostrils daily.     Fluticasone-Umeclidin-Vilant (TRELEGY ELLIPTA) 100-62.5-25 MCG/ACT AEPB INHALE 1 PUFF BY MOUTH EVERY DAY 60 each 11   ibuprofen (ADVIL) 600 MG tablet ibuprofen 600 mg tablet     levothyroxine (SYNTHROID, LEVOTHROID) 50 MCG tablet   5   metFORMIN (GLUCOPHAGE) 1000 MG tablet Take 1,000 mg by mouth 2 (two) times daily with a meal.     mirtazapine (REMERON) 30 MG tablet Take 60 mg by mouth at bedtime.     NONFORMULARY OR COMPOUNDED ITEM Doterra  essential oils     omeprazole (PRILOSEC) 20 MG capsule Take 1 capsule (20 mg total) by mouth daily. 30 capsule 3   rosuvastatin (CRESTOR) 10 MG tablet Take 10 mg by mouth daily.     Amphetamine ER (ADZENYS XR-ODT) 15.7 MG TBED Take by mouth.     DULoxetine (CYMBALTA) 60 MG capsule Take 60 mg by mouth daily.     MYDAYIS 50 MG CP24 Take 1 capsule by mouth daily. (Patient not taking: Reported on 02/15/2022)     No facility-administered medications prior to visit.   Review of Systems  Constitutional:  Negative for chills, fever, malaise/fatigue and weight loss.  HENT:  Negative for congestion, sinus pain and sore throat.   Eyes: Negative.   Respiratory:  Positive for shortness of breath (with exertion). Negative for cough, hemoptysis, sputum production and wheezing.   Cardiovascular:  Negative for chest pain, palpitations, orthopnea, claudication and leg swelling.  Gastrointestinal:  Negative for abdominal pain, heartburn, nausea and vomiting.  Genitourinary: Negative.   Musculoskeletal:  Negative for joint pain and myalgias.  Skin:  Negative for rash.  Neurological:  Negative for weakness.  Endo/Heme/Allergies: Negative.     Objective:   Vitals:   05/31/22 1316  BP: 128/78  Pulse: (!) 103  SpO2: 98%  Weight: 118 lb 9.6 oz (53.8 kg)  Height: 5\' 5"  (1.651 m)   Physical Exam Constitutional:      General: She is not in acute distress.    Appearance: She is not ill-appearing.  HENT:     Head: Normocephalic and atraumatic.  Eyes:     General: No scleral icterus.    Conjunctiva/sclera: Conjunctivae normal.  Cardiovascular:     Rate and Rhythm: Normal rate and regular rhythm.     Pulses: Normal pulses.     Heart sounds: Normal heart sounds. No murmur heard. Pulmonary:     Effort: Pulmonary effort is normal.     Breath sounds: Normal breath sounds. No wheezing, rhonchi or rales.  Musculoskeletal:     Right lower leg: No edema.     Left lower leg: No edema.  Skin:    General:  Skin is warm and dry.  Neurological:     General: No focal deficit present.     Mental Status: She is alert.    CBC    Component Value Date/Time   WBC 6.5 10/06/2020 1218   RBC 4.67 10/06/2020 1218   HGB 14.0 10/06/2020 1218   HCT 41.7 10/06/2020 1218   PLT 280.0 10/06/2020 1218   MCV 89.1 10/06/2020 1218   MCHC 33.6 10/06/2020 1218   RDW 13.6 10/06/2020 1218  LYMPHSABS 1.1 10/06/2020 1218   MONOABS 0.5 10/06/2020 1218   EOSABS 0.3 10/06/2020 1218   BASOSABS 0.0 10/06/2020 1218   Chest imaging: CXR 01/21/20 The heart size and mediastinal contours are within normal limits. Left lateral lung base opacity is favored to relate to overlapping soft tissues given no correlate on the lateral radiograph. No visible pleural effusions. No pneumothorax. Remote left rib fractures. Dense acute osseous abnormality.  PFT:    Latest Ref Rng & Units 07/09/2021   11:59 AM 02/13/2020    3:58 PM  PFT Results  FVC-Pre L 2.62  2.60   FVC-Predicted Pre % 74  72   FVC-Post L 2.70  2.62   FVC-Predicted Post % 76  73   Pre FEV1/FVC % % 84  64   Post FEV1/FCV % % 78  62   FEV1-Pre L 2.19  1.67   FEV1-Predicted Pre % 80  60   FEV1-Post L 2.11  1.61   DLCO uncorrected ml/min/mmHg 10.31  20.48   DLCO UNC% % 48  95   DLCO corrected ml/min/mmHg 10.31  20.48   DLCO COR %Predicted % 48  95   DLVA Predicted % 124  121   TLC L 5.80  4.31   TLC % Predicted % 109  81   RV % Predicted % 108  80      Assessment & Plan:   Asthma with chronic obstructive pulmonary disease (COPD)  Discussion: Karla [name] is a 62 year old woman, non-smoker with anxiety/depression who returns to pulmonary clinic for asthma follow up.   Her asthma appears controlled at this time with Trelegy ellipta 1 puff daily and as needed albuterol.   If she has more issues with her breathing with needing prednisone, we will consider starting her on a biologic therapy.  Follow up in 1 year.  Freda Jackson, MD [facility] Office: [phone]   Current Outpatient Medications:    albuterol (PROVENTIL HFA;VENTOLIN HFA) 108 (90 BASE) MCG/ACT inhaler, Inhale 2 puffs into the lungs every 6 (six) hours as needed for wheezing or shortness of breath., Disp: , Rfl:    ALPRAZolam (XANAX) 1 MG tablet, , Disp: , Rfl:    amphetamine-dextroamphetamine (ADDERALL) 10 MG tablet, Take 5-10 mg by mouth daily., Disp: , Rfl:    benzonatate (TESSALON) 100 MG capsule, Take 100 mg by mouth 3 (three) times daily as needed., Disp: , Rfl:    fexofenadine (ALLEGRA) 180 MG tablet, Take 180 mg by mouth daily., Disp: , Rfl:    fluticasone (FLONASE) 50 MCG/ACT nasal spray, Place 1 spray into both nostrils daily., Disp: , Rfl:    Fluticasone-Umeclidin-Vilant (TRELEGY ELLIPTA) 100-62.5-25 MCG/ACT AEPB, INHALE 1 PUFF BY MOUTH EVERY DAY, Disp: 60 each, Rfl: 11   ibuprofen (ADVIL) 600 MG tablet, ibuprofen 600 mg tablet, Disp: , Rfl:    levothyroxine (SYNTHROID, LEVOTHROID) 50 MCG tablet, , Disp: , Rfl: 5   metFORMIN (GLUCOPHAGE) 1000 MG tablet, Take 1,000 mg by mouth 2 (two) times daily with a meal., Disp: , Rfl:    mirtazapine (REMERON) 30 MG tablet, Take 60 mg by mouth at bedtime., Disp: , Rfl:    NONFORMULARY OR COMPOUNDED ITEM, Doterra essential oils, Disp: , Rfl:    omeprazole (PRILOSEC) 20 MG capsule, Take 1 capsule (20 mg total) by mouth daily., Disp: 30 capsule, Rfl: 3   rosuvastatin (CRESTOR) 10 MG tablet, Take 10 mg by mouth daily., Disp: , Rfl:

## 2022-09-06 ENCOUNTER — Other Ambulatory Visit: Payer: Self-pay | Admitting: Pulmonary Disease

## 2022-09-06 ENCOUNTER — Telehealth: Payer: Self-pay | Admitting: Pulmonary Disease

## 2022-09-06 MED ORDER — PREDNISONE 20 MG PO TABS: 20.0000 mg | ORAL_TABLET | Freq: Every day | ORAL | 0 refills | Status: DC

## 2022-09-06 MED ORDER — ALBUTEROL SULFATE HFA 108 (90 BASE) MCG/ACT IN AERS: 2.0000 | INHALATION_SPRAY | Freq: Four times a day (QID) | RESPIRATORY_TRACT | 1 refills | Status: AC | PRN

## 2022-09-06 MED ORDER — AZITHROMYCIN 250 MG PO TABS: ORAL_TABLET | ORAL | 0 refills | Status: DC

## 2022-09-06 NOTE — Telephone Encounter (Signed)
Azithromycin and prednisone called into pharmacy

## 2022-09-06 NOTE — Progress Notes (Signed)
Course of azithromycin and prednisone called into pharmacy

## 2022-09-06 NOTE — Telephone Encounter (Signed)
Called and spoke w/ pt she verbalized that she is having ongoing increased sputum (multi-colored), increased coughing & SOB, worsening in the last week. Denies running fever.   She is currently using [facility] [address]

## 2022-09-06 NOTE — Telephone Encounter (Signed)
Called and spoke w/ pt she verbalized understanding that her medications were sent to preferred pharmacy. NFN att.

## 2022-09-06 NOTE — Telephone Encounter (Signed)
Patient states having symptoms of cough, shortness of breath and mucus. Pharmacy is [facility] [address] Patient phone number is [phone].

## 2022-09-14 IMAGING — CT CT CHEST HIGH RESOLUTION
2 of 7 series · 14 of 36 positions shown, 17 images · non-contrast
Comparison: 01/21/2020. Chest radiograph.

CLINICAL DATA: COPD. COVID pneumonia in April 2019. Rib fractures
from fall in March 2021. Evaluate for interstitial lung disease.



[Series 6: high resolution · axial · 0.57mm/px · z∈[-275,-34]mm · 11 of 291 slices shown, 14 images]
[im 25/291  mediastinal]
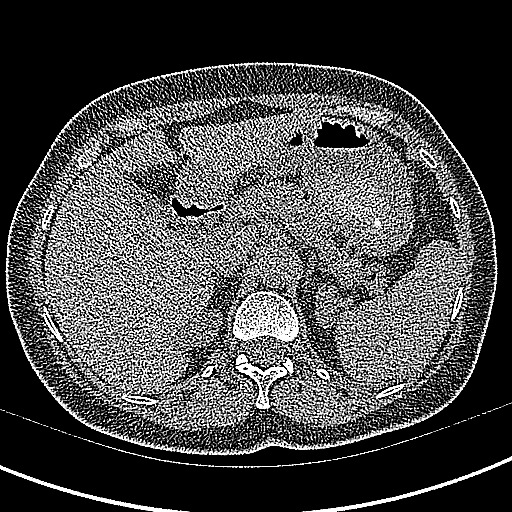
[im 25/291  lung]
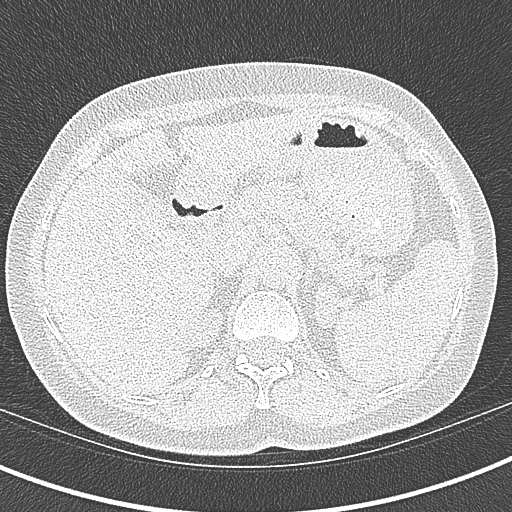
[im 49/291  lung]
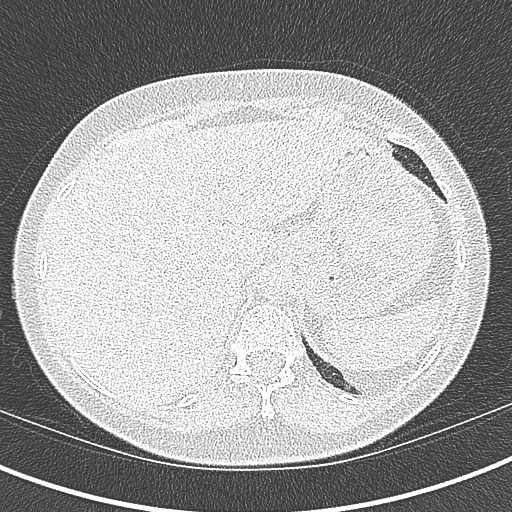
[im 73/291  lung]
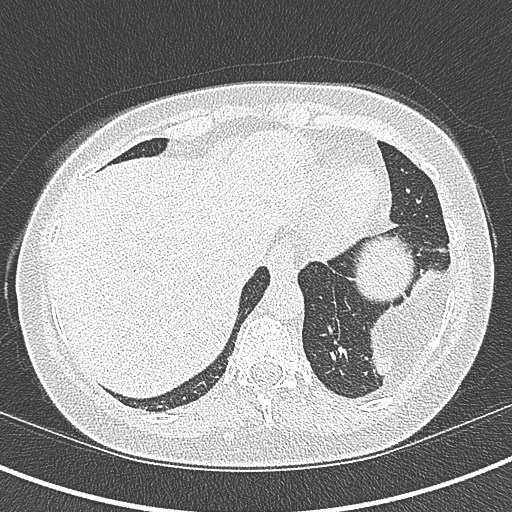
[im 97/291  lung]
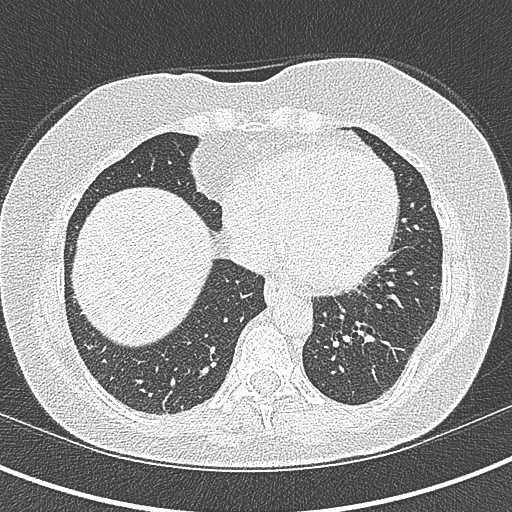
[im 121/291  mediastinal]
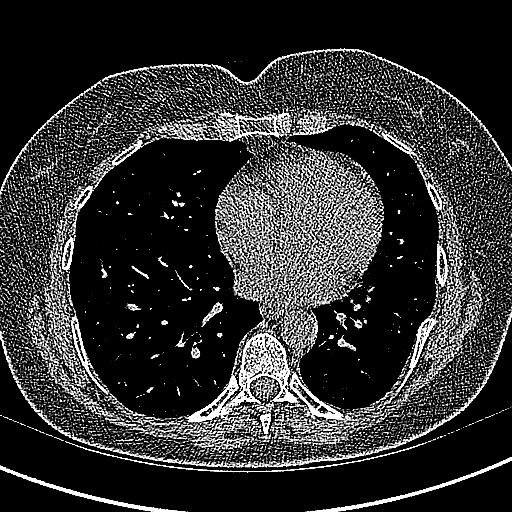
[im 121/291  lung]
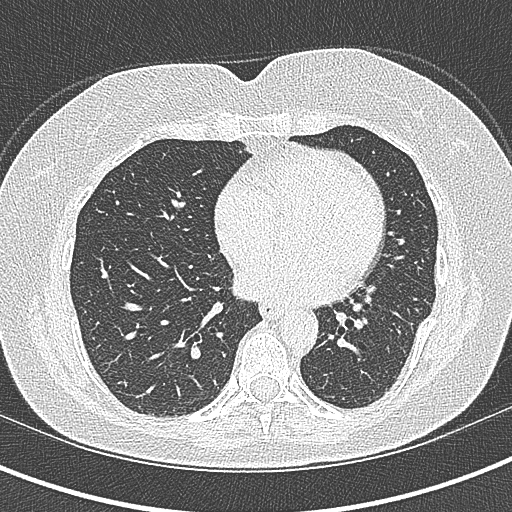
[im 146/291  lung]
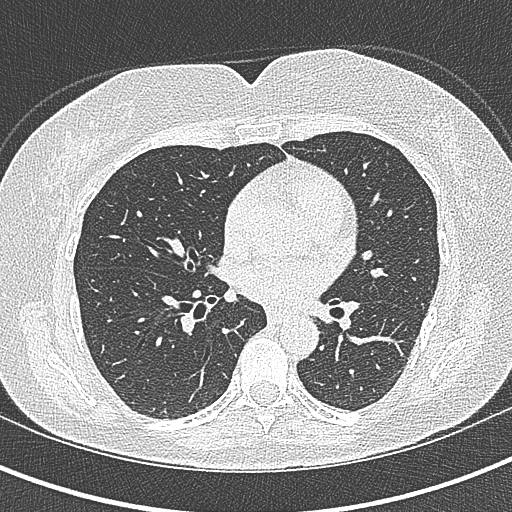
[im 170/291  lung]
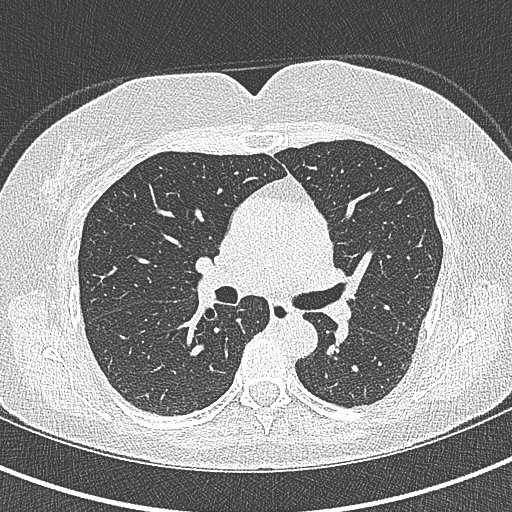
[im 194/291  lung]
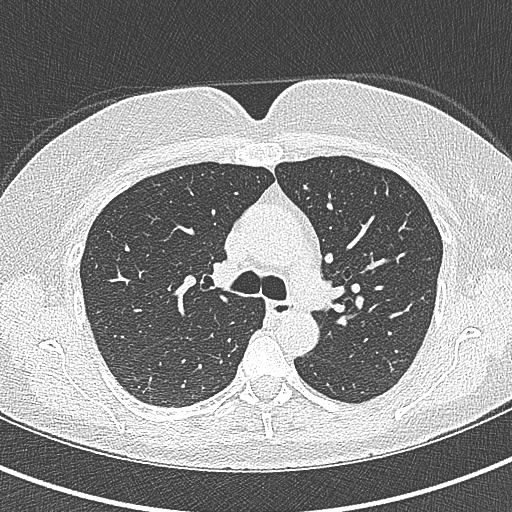
[im 218/291  mediastinal]
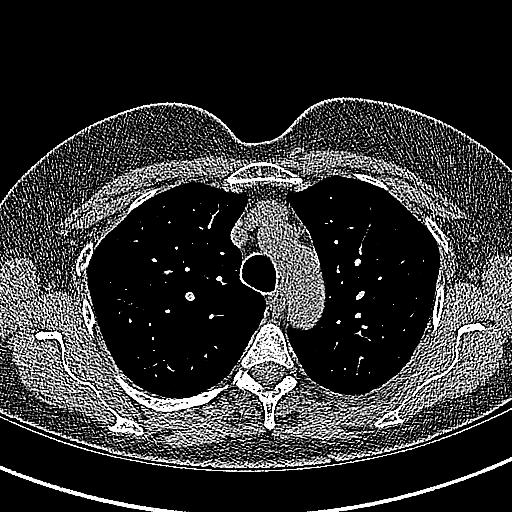
[im 218/291  lung]
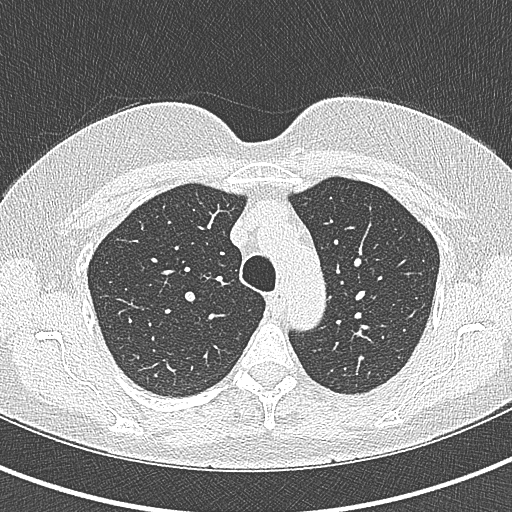
[im 242/291  lung]
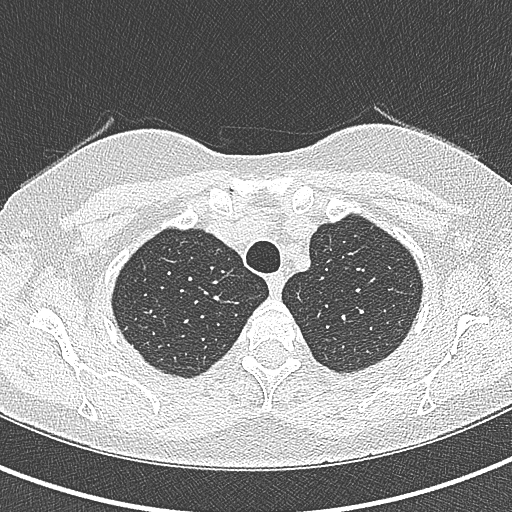
[im 266/291  lung]
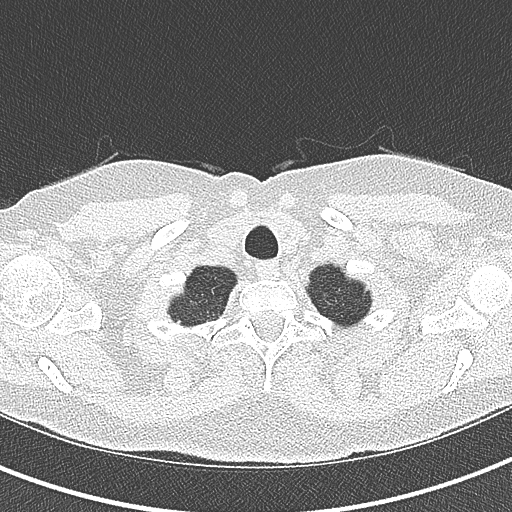

[Series 8: coronal · coronal · 0.54mm/px · 3 of 104 slices shown]
[im 21/104  lung]
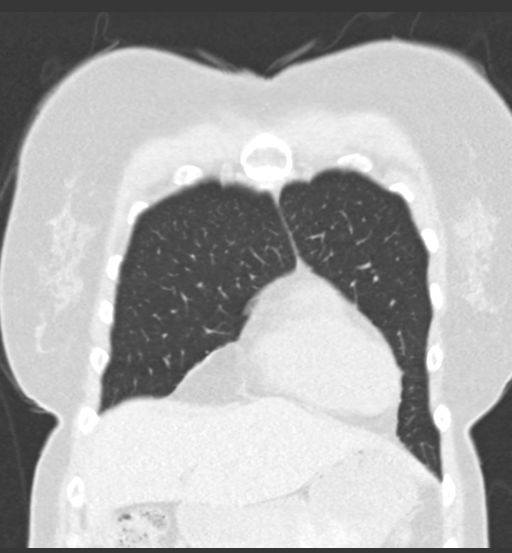
[im 42/104  lung]
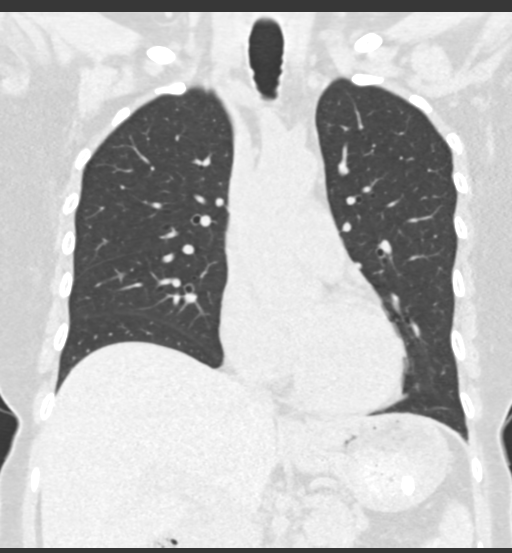
[im 62/104  lung]
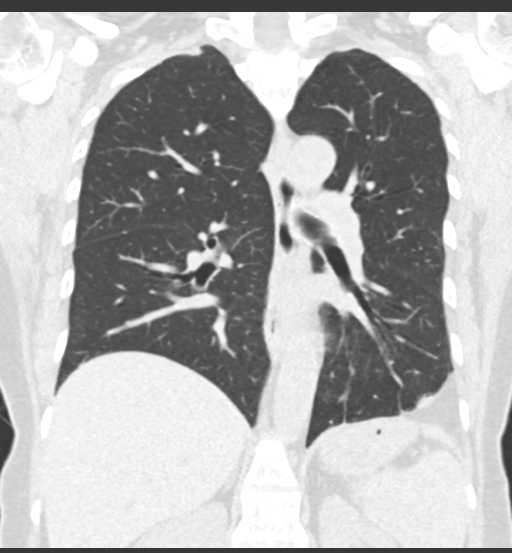

[14 of 36 positions shown; findings below may reference images not displayed]

FINDINGS: Cardiovascular: Normal heart size. No significant pericardial
effusion/thickening. Atherosclerotic nonaneurysmal thoracic aorta.
Normal caliber pulmonary arteries.

Mediastinum/Nodes: No discrete thyroid nodules. Unremarkable
esophagus. No pathologically enlarged axillary, mediastinal or hilar
lymph nodes, noting limited sensitivity for the detection of hilar
adenopathy on this noncontrast study.

Lungs/Pleura: No pneumothorax. No pleural effusion. No acute
consolidative airspace disease or lung masses. Solid 0.3 cm anterior
right lower lobe pulmonary nodule (series 5/image 83). No additional
significant pulmonary nodules. No significant lobular air trapping
or evidence of tracheobronchomalacia on the expiration sequence. No
significant regions of subpleural reticulation, ground-glass
opacity, architectural distortion or frank honeycombing. Borderline
mild cylindrical bronchiectasis centrally in the mid to lower lungs.
Mildly thickened bandlike subpleural consolidation with associated
volume loss in the peripheral basilar left lower lobe.

Upper abdomen: Small hiatal hernia.

Musculoskeletal: No aggressive appearing focal osseous lesions.
Healed posterolateral left seventh, eighth and ninth rib fractures.
Adjacent 6 subpleural fat in the peripheral basilar left pleural
space with adjacent lateral left diaphragm defect (series 8/image
65).
IMPRESSION: 1. No evidence of interstitial lung disease.
2. Probable posttraumatic lateral left hemidiaphragm rupture with
herniated fat in the lateral basilar left pleural space and adjacent
mild compressive atelectasis versus scarring and with adjacent
healed lateral lower left rib fractures.
3. Solid 0.3 cm right lower lobe pulmonary nodule. No routine
follow-up imaging is recommended per [HOSPITAL] Guidelines.
These guidelines do not apply to immunocompromised patients and
patients with cancer. Follow up in patients with significant
comorbidities as clinically warranted. For lung cancer screening,
adhere to Lung-RADS guidelines. Reference: Radiology. 1386;
4.  Aortic Atherosclerosis ([id number]).

## 2022-10-20 ENCOUNTER — Other Ambulatory Visit: Payer: Self-pay | Admitting: Pulmonary Disease

## 2023-01-26 ENCOUNTER — Other Ambulatory Visit (HOSPITAL_COMMUNITY): Payer: Self-pay

## 2023-01-26 MED ORDER — AMPHETAMINE-DEXTROAMPHET ER 30 MG PO CP24
30.0000 mg | ORAL_CAPSULE | Freq: Every morning | ORAL | 0 refills | Status: AC
  Filled 2023-01-26 (×2): qty 30, 30d supply, fill #0

## 2023-06-16 ENCOUNTER — Ambulatory Visit (INDEPENDENT_AMBULATORY_CARE_PROVIDER_SITE_OTHER): Payer: 59 | Admitting: Pulmonary Disease

## 2023-06-16 ENCOUNTER — Encounter: Payer: Self-pay | Admitting: Pulmonary Disease

## 2023-06-16 VITALS — BP 142/94 | HR 105 | Ht 66.0 in | Wt 116.6 lb

## 2023-06-16 DIAGNOSIS — R0609 Other forms of dyspnea: Secondary | ICD-10-CM

## 2023-06-16 DIAGNOSIS — J4489 Other specified chronic obstructive pulmonary disease: Secondary | ICD-10-CM

## 2023-06-16 MED ORDER — TRELEGY ELLIPTA 100-62.5-25 MCG/ACT IN AEPB
1.0000 | INHALATION_SPRAY | Freq: Every day | RESPIRATORY_TRACT | 11 refills | Status: AC
Start: 2023-06-16 — End: ?

## 2023-06-16 NOTE — Progress Notes (Signed)
 Synopsis: Referred by Angus Seller, NP for cough, dyspnea and pleural effusion in 12/2019  Subjective:   PATIENT ID: Karla [name] GENDER: female DOB: 11-21-1960, MRN: [id number]  HPI  Chief Complaint  Patient presents with   Follow-up   Karla [name] is a 63 year old woman, non-smoker with anxiety/depression who returns to pulmonary clinic for asthma follow up.   She experiences increased mucus production and respiratory flare-ups during October, November, January, and February. These episodes have not been severe enough to require additional medication. On the morning of the visit, she had a significant amount of discolored mucus, which was unexpected as she had recently seen another doctor and felt fine at that time.  She engages in water aerobics a couple of times a week but has stopped yoga as it was not beneficial. Walking is limited due to weather conditions, but she uses her asthma inhaler before walking or swimming, which she finds helpful. She uses a walking stick for balance during walks. Exertional shortness of breath limits her ability to run errands and perform household chores. She typically orders groceries curbside but prefers to select fresh produce herself. She visits [employer] every few weeks but can only manage a couple of tasks before becoming fatigued.  Her current medications include Trelegy 100, as-needed albuterol, Flonase nasal spray, and daily Allegra. She recalls having used Singulair (montelukast) in the past but not recently.    OV 05/31/22 She has been doing well since last visit. She retired 03/25/22. She has started doing Yoga. She is using trelegy 1 puff daily. She reports trouble with her breathing after flying recently. She received prednisone after the second time this occurred which helped.  OV 05/28/21 She has been doing well on trelegy ellipta 1 puff daily. She uses albuterol as needed occasionally. She is now working a reduced work schedule due to on  going brain fog and fatigue since having covid 19. She continues to have some exertional dyspnea.   Her reflux symptoms are improved with elevated the head of her bed.   Past Medical History:  Diagnosis Date   ADD (attention deficit disorder)    Anxiety    COPD (chronic obstructive pulmonary disease) (HCC)    Depression    Hyperlipidemia    Hypothyroidism    Insomnia    Migraine    Noncompliance with CPAP treatment    OSA (obstructive sleep apnea)    Snoring 04/21/2014     Family History  Problem Relation Age of Onset   Diabetes Mother    Heart disease Mother    Asthma Father    Lung cancer Maternal Grandfather    Skin cancer Paternal Grandmother    Alcoholism Paternal Grandfather    Prostate cancer Paternal Grandfather    Hypertension Other      Social History   Socioeconomic History   Marital status: Single    Spouse name: Not on file   Number of children: Not on file   Years of education: Not on file   Highest education level: Not on file  Occupational History   Not on file  Tobacco Use   Smoking status: Never   Smokeless tobacco: Never  Substance and Sexual Activity   Alcohol use: Yes    Alcohol/week: 0.0 standard drinks of alcohol    Comment: wine   Drug use: No   Sexual activity: Not on file  Other Topics Concern   Not on file  Social History Narrative   Caffeine 2 drinks  per day.  Divorced with no children, 2 step sons, 1 grandchild.  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     Social Drivers of Corporate investment banker Strain: Not on file  Food Insecurity: Not on file  Transportation Needs: Not on file  Physical Activity: Not on file  Stress: Not on file  Social Connections: Unknown (09/07/2021)   Received from [facility], [facility]   Social Network    Social Network: Not on file  Intimate Partner Violence: Unknown (07/30/2021)   Received from [facility], [facility]   HITS    Physically Hurt: Not on file    Insult or Talk Down To: Not  on file    Threaten Physical Harm: Not on file    Scream or Curse: Not on file     Allergies  Allergen Reactions   Bupropion Other (See Comments)   Sulfa Antibiotics      Outpatient Medications Prior to Visit  Medication Sig Dispense Refill   albuterol (VENTOLIN HFA) 108 (90 Base) MCG/ACT inhaler Inhale 2 puffs into the lungs every 6 (six) hours as needed for wheezing or shortness of breath. 8 g 1   ALPRAZolam (XANAX) 1 MG tablet      amphetamine-dextroamphetamine (ADDERALL XR) 30 MG 24 hr capsule Take 1 capsule (30 mg total) by mouth every morning. 30 capsule 0   amphetamine-dextroamphetamine (ADDERALL) 10 MG tablet Take 5-10 mg by mouth daily.     benzonatate (TESSALON) 100 MG capsule Take 100 mg by mouth 3 (three) times daily as needed.     fexofenadine (ALLEGRA) 180 MG tablet Take 180 mg by mouth daily.     fluticasone (FLONASE) 50 MCG/ACT nasal spray Place 1 spray into both nostrils daily.     ibuprofen (ADVIL) 600 MG tablet ibuprofen 600 mg tablet     levothyroxine (SYNTHROID, LEVOTHROID) 50 MCG tablet   5   metFORMIN (GLUCOPHAGE) 1000 MG tablet Take 1,000 mg by mouth 2 (two) times daily with a meal.     mirtazapine (REMERON) 30 MG tablet Take 60 mg by mouth at bedtime.     NONFORMULARY OR COMPOUNDED ITEM Doterra essential oils     omeprazole (PRILOSEC) 20 MG capsule Take 1 capsule (20 mg total) by mouth daily. 30 capsule 3   rosuvastatin (CRESTOR) 10 MG tablet Take 10 mg by mouth daily.     azithromycin (ZITHROMAX Z-PAK) 250 MG tablet Take 2 tablets day 1 and then 1 daily for 4 days 6 each 0   Fluticasone-Umeclidin-Vilant (TRELEGY ELLIPTA) 100-62.5-25 MCG/ACT AEPB INHALE 1 PUFF DAILY AS DIRECTED 60 each 11   predniSONE (DELTASONE) 20 MG tablet Take 1 tablet (20 mg total) by mouth daily with breakfast. 7 tablet 0   No facility-administered medications prior to visit.   Review of Systems  Constitutional:  Negative for chills, fever, malaise/fatigue and weight loss.  HENT:   Negative for congestion, sinus pain and sore throat.   Eyes: Negative.   Respiratory:  Positive for shortness of breath (with exertion). Negative for cough, hemoptysis, sputum production and wheezing.   Cardiovascular:  Negative for chest pain, palpitations, orthopnea, claudication and leg swelling.  Gastrointestinal:  Negative for abdominal pain, heartburn, nausea and vomiting.  Genitourinary: Negative.   Musculoskeletal:  Negative for joint pain and myalgias.  Skin:  Negative for rash.  Neurological:  Negative for weakness.  Endo/Heme/Allergies: Negative.     Objective:   Vitals:   06/16/23 1009  BP: (!) 142/94  Pulse: (!) 105  SpO2: 98%  Weight: 116 lb 9.6 oz (52.9 kg)  Height: 5\' 6"  (1.676 m)   Physical Exam Constitutional:      General: She is not in acute distress.    Appearance: She is not ill-appearing.  HENT:     Head: Normocephalic and atraumatic.  Eyes:     General: No scleral icterus.    Conjunctiva/sclera: Conjunctivae normal.  Cardiovascular:     Rate and Rhythm: Normal rate and regular rhythm.     Pulses: Normal pulses.     Heart sounds: Normal heart sounds. No murmur heard. Pulmonary:     Effort: Pulmonary effort is normal.     Breath sounds: Normal breath sounds. No wheezing, rhonchi or rales.  Musculoskeletal:     Right lower leg: No edema.     Left lower leg: No edema.  Skin:    General: Skin is warm and dry.  Neurological:     General: No focal deficit present.     Mental Status: She is alert.    CBC    Component Value Date/Time   WBC 6.5 10/06/2020 1218   RBC 4.67 10/06/2020 1218   HGB 14.0 10/06/2020 1218   HCT 41.7 10/06/2020 1218   PLT 280.0 10/06/2020 1218   MCV 89.1 10/06/2020 1218   MCHC 33.6 10/06/2020 1218   RDW 13.6 10/06/2020 1218   LYMPHSABS 1.1 10/06/2020 1218   MONOABS 0.5 10/06/2020 1218   EOSABS 0.3 10/06/2020 1218   BASOSABS 0.0 10/06/2020 1218   Chest imaging: CXR 01/21/20 The heart size and mediastinal contours  are within normal limits. Left lateral lung base opacity is favored to relate to overlapping soft tissues given no correlate on the lateral radiograph. No visible pleural effusions. No pneumothorax. Remote left rib fractures. Dense acute osseous abnormality.  PFT:    Latest Ref Rng & Units 07/09/2021   11:59 AM 02/13/2020    3:58 PM  PFT Results  FVC-Pre L 2.62  2.60   FVC-Predicted Pre % 74  72   FVC-Post L 2.70  2.62   FVC-Predicted Post % 76  73   Pre FEV1/FVC % % 84  64   Post FEV1/FCV % % 78  62   FEV1-Pre L 2.19  1.67   FEV1-Predicted Pre % 80  60   FEV1-Post L 2.11  1.61   DLCO uncorrected ml/min/mmHg 10.31  20.48   DLCO UNC% % 48  95   DLCO corrected ml/min/mmHg 10.31  20.48   DLCO COR %Predicted % 48  95   DLVA Predicted % 124  121   TLC L 5.80  4.31   TLC % Predicted % 109  81   RV % Predicted % 108  80      Assessment & Plan:   Asthma with chronic obstructive pulmonary disease (COPD) (HCC) - Plan: Fluticasone-Umeclidin-Vilant (TRELEGY ELLIPTA) 100-62.5-25 MCG/ACT AEPB  Exertional dyspnea - Plan: ECHOCARDIOGRAM COMPLETE  Discussion: Karla [name] is a 63 year old woman, non-smoker with anxiety/depression who returns to pulmonary clinic for asthma follow up.   Asthma-COPD Stable with occasional flare-ups. No significant exertional shortness of breath. Patient is maintaining activities including water aerobics and walking. -Continue Trelegy 100 daily and Albuterol as needed. -Refill Trelegy prescription.  Cardiac Evaluation Family history of heart disease. No personal history of cardiac symptoms. -Order echocardiogram to evaluate cardiac function.  Follow up in 1 year.  Melody Comas, MD [facility] Office: [phone]   Current Outpatient Medications:  albuterol (VENTOLIN HFA) 108 (90 Base) MCG/ACT inhaler, Inhale 2 puffs into the lungs every 6 (six) hours as needed for wheezing or shortness of breath., Disp: 8 g, Rfl: 1    ALPRAZolam (XANAX) 1 MG tablet, , Disp: , Rfl:    amphetamine-dextroamphetamine (ADDERALL XR) 30 MG 24 hr capsule, Take 1 capsule (30 mg total) by mouth every morning., Disp: 30 capsule, Rfl: 0   amphetamine-dextroamphetamine (ADDERALL) 10 MG tablet, Take 5-10 mg by mouth daily., Disp: , Rfl:    benzonatate (TESSALON) 100 MG capsule, Take 100 mg by mouth 3 (three) times daily as needed., Disp: , Rfl:    fexofenadine (ALLEGRA) 180 MG tablet, Take 180 mg by mouth daily., Disp: , Rfl:    fluticasone (FLONASE) 50 MCG/ACT nasal spray, Place 1 spray into both nostrils daily., Disp: , Rfl:    ibuprofen (ADVIL) 600 MG tablet, ibuprofen 600 mg tablet, Disp: , Rfl:    levothyroxine (SYNTHROID, LEVOTHROID) 50 MCG tablet, , Disp: , Rfl: 5   metFORMIN (GLUCOPHAGE) 1000 MG tablet, Take 1,000 mg by mouth 2 (two) times daily with a meal., Disp: , Rfl:    mirtazapine (REMERON) 30 MG tablet, Take 60 mg by mouth at bedtime., Disp: , Rfl:    NONFORMULARY OR COMPOUNDED ITEM, Doterra essential oils, Disp: , Rfl:    omeprazole (PRILOSEC) 20 MG capsule, Take 1 capsule (20 mg total) by mouth daily., Disp: 30 capsule, Rfl: 3   rosuvastatin (CRESTOR) 10 MG tablet, Take 10 mg by mouth daily., Disp: , Rfl:    Fluticasone-Umeclidin-Vilant (TRELEGY ELLIPTA) 100-62.5-25 MCG/ACT AEPB, Inhale 1 puff into the lungs daily., Disp: 28 each, Rfl: 11

## 2023-06-16 NOTE — Patient Instructions (Addendum)
 Continue trelegy ellipta 1 puff daily - rinse mouth out after each use  Use albuterol inhaler 1-2 puffs every 4-6 hours as needed  Continue allegra daily  We can consider adding montelukast in the future if you feel allergies are bothering your breathing  We will schedule you for an echocardiogram to evaluate your shortness of breath  Follow up in 1 year, call sooner if needed

## 2023-07-07 ENCOUNTER — Ambulatory Visit (HOSPITAL_COMMUNITY): Payer: 59 | Attending: Cardiovascular Disease

## 2023-07-07 DIAGNOSIS — R0609 Other forms of dyspnea: Secondary | ICD-10-CM | POA: Insufficient documentation

## 2023-07-07 LAB — ECHOCARDIOGRAM COMPLETE
Area-P 1/2: 3.53 cm2
S' Lateral: 3.2 cm

## 2023-07-10 ENCOUNTER — Ambulatory Visit: Payer: Self-pay | Admitting: Pulmonary Disease

## 2023-07-19 ENCOUNTER — Encounter: Payer: Self-pay | Admitting: Pulmonary Disease

## 2024-04-30 ENCOUNTER — Encounter: Payer: Self-pay | Admitting: Gastroenterology

## 2024-05-28 ENCOUNTER — Ambulatory Visit: Admitting: Gastroenterology

## 2024-05-29 ENCOUNTER — Encounter: Payer: Self-pay | Admitting: Gastroenterology

## 2024-06-18 ENCOUNTER — Ambulatory Visit: Admitting: Pulmonary Disease

## 2024-06-19 ENCOUNTER — Ambulatory Visit: Admitting: Gastroenterology

## 2024-06-25 ENCOUNTER — Ambulatory Visit: Admitting: Gastroenterology
